# Patient Record
Sex: Male | Born: 1940 | Race: White | Hispanic: No | Marital: Married | Smoking: Former smoker
Health system: Southern US, Community
[De-identification: ages and names within clinical notes are randomized; demographics above are authoritative.]

## PROBLEM LIST (undated history)

## (undated) DIAGNOSIS — G912 (Idiopathic) normal pressure hydrocephalus: Secondary | ICD-10-CM

## (undated) DIAGNOSIS — M5410 Radiculopathy, site unspecified: Secondary | ICD-10-CM

## (undated) DIAGNOSIS — M5030 Other cervical disc degeneration, unspecified cervical region: Secondary | ICD-10-CM

## (undated) DIAGNOSIS — F319 Bipolar disorder, unspecified: Secondary | ICD-10-CM

## (undated) DIAGNOSIS — G43909 Migraine, unspecified, not intractable, without status migrainosus: Secondary | ICD-10-CM

## (undated) DIAGNOSIS — M109 Gout, unspecified: Secondary | ICD-10-CM

## (undated) DIAGNOSIS — I447 Left bundle-branch block, unspecified: Secondary | ICD-10-CM

## (undated) DIAGNOSIS — G219 Secondary parkinsonism, unspecified: Principal | ICD-10-CM

## (undated) DIAGNOSIS — K5790 Diverticulosis of intestine, part unspecified, without perforation or abscess without bleeding: Secondary | ICD-10-CM

## (undated) DIAGNOSIS — E785 Hyperlipidemia, unspecified: Secondary | ICD-10-CM

## (undated) DIAGNOSIS — F988 Other specified behavioral and emotional disorders with onset usually occurring in childhood and adolescence: Secondary | ICD-10-CM

## (undated) DIAGNOSIS — R269 Unspecified abnormalities of gait and mobility: Secondary | ICD-10-CM

## (undated) DIAGNOSIS — I493 Ventricular premature depolarization: Secondary | ICD-10-CM

## (undated) DIAGNOSIS — I10 Essential (primary) hypertension: Secondary | ICD-10-CM

## (undated) DIAGNOSIS — F329 Major depressive disorder, single episode, unspecified: Secondary | ICD-10-CM

## (undated) DIAGNOSIS — G910 Communicating hydrocephalus: Secondary | ICD-10-CM

## (undated) DIAGNOSIS — F32A Depression, unspecified: Secondary | ICD-10-CM

## (undated) DIAGNOSIS — B029 Zoster without complications: Secondary | ICD-10-CM

## (undated) DIAGNOSIS — N400 Enlarged prostate without lower urinary tract symptoms: Secondary | ICD-10-CM

## (undated) DIAGNOSIS — G9389 Other specified disorders of brain: Secondary | ICD-10-CM

## (undated) HISTORY — DX: Hyperlipidemia, unspecified: E78.5

## (undated) HISTORY — DX: Secondary parkinsonism, unspecified: G21.9

## (undated) HISTORY — DX: Unspecified abnormalities of gait and mobility: R26.9

## (undated) HISTORY — DX: Bipolar disorder, unspecified: F31.9

## (undated) HISTORY — DX: Depression, unspecified: F32.A

## (undated) HISTORY — DX: Benign prostatic hyperplasia without lower urinary tract symptoms: N40.0

## (undated) HISTORY — DX: Gout, unspecified: M10.9

## (undated) HISTORY — DX: (Idiopathic) normal pressure hydrocephalus: G91.2

## (undated) HISTORY — DX: Other cervical disc degeneration, unspecified cervical region: M50.30

## (undated) HISTORY — DX: Radiculopathy, site unspecified: M54.10

## (undated) HISTORY — DX: Essential (primary) hypertension: I10

## (undated) HISTORY — DX: Migraine, unspecified, not intractable, without status migrainosus: G43.909

## (undated) HISTORY — PX: BELPHAROPTOSIS REPAIR: SHX369

## (undated) HISTORY — DX: Diverticulosis of intestine, part unspecified, without perforation or abscess without bleeding: K57.90

## (undated) HISTORY — DX: Ventricular premature depolarization: I49.3

## (undated) HISTORY — PX: TONSILLECTOMY AND ADENOIDECTOMY: SUR1326

## (undated) HISTORY — DX: Zoster without complications: B02.9

## (undated) HISTORY — PX: CATARACT EXTRACTION: SUR2

## (undated) HISTORY — DX: Other specified disorders of brain: G93.89

## (undated) HISTORY — DX: Left bundle-branch block, unspecified: I44.7

## (undated) HISTORY — DX: Communicating hydrocephalus: G91.0

## (undated) HISTORY — DX: Major depressive disorder, single episode, unspecified: F32.9

## (undated) HISTORY — DX: Other specified behavioral and emotional disorders with onset usually occurring in childhood and adolescence: F98.8

---

## 1999-04-09 ENCOUNTER — Ambulatory Visit (HOSPITAL_BASED_OUTPATIENT_CLINIC_OR_DEPARTMENT_OTHER): Admission: RE | Admit: 1999-04-09 | Discharge: 1999-04-09 | Payer: Self-pay

## 2003-04-08 ENCOUNTER — Encounter: Admission: RE | Admit: 2003-04-08 | Discharge: 2003-04-08 | Payer: Self-pay | Admitting: Family Medicine

## 2003-04-08 ENCOUNTER — Encounter: Payer: Self-pay | Admitting: Family Medicine

## 2005-09-26 ENCOUNTER — Encounter: Admission: RE | Admit: 2005-09-26 | Discharge: 2005-09-26 | Payer: Self-pay | Admitting: Family Medicine

## 2005-10-03 ENCOUNTER — Encounter: Admission: RE | Admit: 2005-10-03 | Discharge: 2005-10-03 | Payer: Self-pay | Admitting: Family Medicine

## 2005-11-02 ENCOUNTER — Ambulatory Visit: Payer: Self-pay | Admitting: Cardiology

## 2005-11-15 ENCOUNTER — Ambulatory Visit: Payer: Self-pay

## 2005-11-15 ENCOUNTER — Encounter: Payer: Self-pay | Admitting: Cardiology

## 2005-11-21 ENCOUNTER — Ambulatory Visit: Payer: Self-pay | Admitting: Cardiology

## 2006-01-24 ENCOUNTER — Encounter (INDEPENDENT_AMBULATORY_CARE_PROVIDER_SITE_OTHER): Payer: Self-pay | Admitting: *Deleted

## 2006-01-24 ENCOUNTER — Ambulatory Visit (HOSPITAL_BASED_OUTPATIENT_CLINIC_OR_DEPARTMENT_OTHER): Admission: RE | Admit: 2006-01-24 | Discharge: 2006-01-24 | Payer: Self-pay | Admitting: Plastic Surgery

## 2007-02-20 ENCOUNTER — Encounter: Payer: Self-pay | Admitting: Cardiology

## 2007-02-20 ENCOUNTER — Ambulatory Visit: Payer: Self-pay

## 2008-04-19 ENCOUNTER — Encounter: Payer: Self-pay | Admitting: Emergency Medicine

## 2008-04-20 ENCOUNTER — Ambulatory Visit: Payer: Self-pay | Admitting: Vascular Surgery

## 2008-04-20 ENCOUNTER — Encounter (INDEPENDENT_AMBULATORY_CARE_PROVIDER_SITE_OTHER): Payer: Self-pay | Admitting: Internal Medicine

## 2008-04-20 ENCOUNTER — Inpatient Hospital Stay (HOSPITAL_COMMUNITY): Admission: EM | Admit: 2008-04-20 | Discharge: 2008-04-20 | Payer: Self-pay | Admitting: Emergency Medicine

## 2008-04-20 ENCOUNTER — Ambulatory Visit: Payer: Self-pay | Admitting: Cardiovascular Disease

## 2008-10-24 ENCOUNTER — Encounter: Admission: RE | Admit: 2008-10-24 | Discharge: 2008-10-24 | Payer: Self-pay | Admitting: Family Medicine

## 2009-01-25 ENCOUNTER — Ambulatory Visit (HOSPITAL_COMMUNITY): Payer: Self-pay | Admitting: Psychiatry

## 2009-04-21 ENCOUNTER — Ambulatory Visit (HOSPITAL_COMMUNITY): Payer: Self-pay | Admitting: Psychiatry

## 2009-05-17 ENCOUNTER — Encounter: Admission: RE | Admit: 2009-05-17 | Discharge: 2009-05-17 | Payer: Self-pay | Admitting: Family Medicine

## 2009-07-25 ENCOUNTER — Encounter: Admission: RE | Admit: 2009-07-25 | Discharge: 2009-07-25 | Payer: Self-pay | Admitting: Family Medicine

## 2010-02-08 ENCOUNTER — Encounter: Admission: RE | Admit: 2010-02-08 | Discharge: 2010-02-08 | Payer: Self-pay | Admitting: Family Medicine

## 2010-05-16 ENCOUNTER — Ambulatory Visit (HOSPITAL_COMMUNITY): Admission: RE | Admit: 2010-05-16 | Discharge: 2010-05-16 | Payer: Self-pay | Admitting: Family Medicine

## 2010-05-16 ENCOUNTER — Ambulatory Visit: Payer: Self-pay | Admitting: Cardiology

## 2010-05-16 ENCOUNTER — Ambulatory Visit: Payer: Self-pay

## 2010-11-19 ENCOUNTER — Encounter: Payer: Self-pay | Admitting: Family Medicine

## 2010-11-27 ENCOUNTER — Encounter
Admission: RE | Admit: 2010-11-27 | Discharge: 2010-11-27 | Payer: Self-pay | Source: Home / Self Care | Attending: Family Medicine | Admitting: Family Medicine

## 2011-02-08 ENCOUNTER — Other Ambulatory Visit: Payer: Self-pay | Admitting: Family Medicine

## 2011-03-12 NOTE — H&P (Signed)
NAME:  Clayton Burke, Clayton Burke                  ACCOUNT NO.:  [id number]   MEDICAL RECORD NO.:  [id number]          PATIENT TYPE:  INP   LOCATION:  3011                         FACILITY:  [facility]   PHYSICIAN:  Lonia Blood, M.D.       DATE OF BIRTH:  September 03, 1941   DATE OF ADMISSION:  04/19/2008  DATE OF DISCHARGE:  04/20/2008                              HISTORY & PHYSICAL   PRIMARY CARE PHYSICIAN:  Mosetta Putt, MD   CHIEF COMPLAINT:  Blurry vision and difficulty with walking.   HISTORY OF PRESENT ILLNESS:  Clayton Burke is a 70 year old gentleman with  history of hypertension, hyperlipidemia who presented tonight at the  [facility] after 2 weeks of episodes of double vision.  This  morning, he also had an episode where he stumbled and could not walk.  At that point in time, he went to the emergency room at [facility] where he had a stat MRI of the brain which did not indicate  presence of an acute stroke.  He was discharged home, but then as he was  walking his dogs he had again an episode of possible difficulty walking.  He currently feels back to his normal self.   PAST MEDICAL HISTORY:  1. Depression.  2. Migraine headaches.  3. BPH.  4. Hypertension.  5. Hyperlipidemia.  6. PVCs.   HOME MEDICATIONS:  Flomax, finasteride, Cozaar, Inderal, Paxil,  lovastatin, Ambien, and alprazolam.   SOCIAL HISTORY:  The patient is married.  He has a very remote history  of tobacco use.  No alcohol or drugs.   FAMILY HISTORY:  Positive for massive stroke in father.   REVIEW OF SYSTEMS:  As per HPI.  All other systems reviewed and  negative.   PHYSICAL EXAMINATION:  VITAL SIGNS:  Upon admission indicates,  temperature 97.1, heart rate 54, respiratory rate 20, blood pressure  140/71, saturation 98% on room air.  GENERAL APPEARANCE:  Well-developed, well-nourished, in no acute  distress.  Alert and oriented to place, person, and time.  HEAD:  Normocephalic and atraumatic.  EYES:   Pupils equal, round, and reactive to light and accommodation.  Extraocular movements intact.  THROAT:  Clear.  NECK:  Supple.  No JVD.  CHEST:  Clear without wheezes or crackles.  HEART:  Regular rate and rhythm without murmurs, rubs, or gallops.  ABDOMEN:  Soft, nontender, nondistended, and bowel sounds are present.  LOWER EXTREMITIES:  Without edema.  NEUROLOGIC:  Cranial nerves II-XII intact.  Repetition intact.  Speech  intact.  Memory, he is able to remember 2/3 works.  He is able to do  serial 7s.  Cerebellum is intact.  Strength 5/5 in all 4 extremities.  Sensation intact.  Deep tendon reflexes are symmetric.   LABORATORY DATA:  Laboratory values done on admission pending.   ASSESSMENT/PLAN:  Probable transient ischemic attack in the posterior  circulation versus orthostasis versus symptomatic premature ventricular  contractions. I plan to admit the patient to telemetry.  Place him on  frequent neuro checks.  Add Plavix to his aspirin and obtain  an MRA of  the brain.  I will also check orthostatics, decrease the dose of  Lamictal, and keep the patient on telemetry to see how many premature  ventricular contractions he develops.  Neurological consultation will be  obtained in the morning.      Lonia Blood, M.D.  Electronically Signed     SL/MEDQ  D:  04/20/2008  T:  04/21/2008  Job:  [id number]   cc:   Mosetta Putt, M.D.

## 2011-03-12 NOTE — Consult Note (Signed)
NAME:  Clayton Burke, Clayton Burke                  ACCOUNT NO.:  [id number]   MEDICAL RECORD NO.:  [id number]          PATIENT TYPE:  INP   LOCATION:  3011                         FACILITY:  [facility]   PHYSICIAN:  Levert Feinstein, MD          DATE OF BIRTH:  07/05/1941   DATE OF CONSULTATION:  04/20/2008  DATE OF DISCHARGE:  04/20/2008                                 CONSULTATION   CHIEF COMPLAINT:  Double vision.   HISTORY OF PRESENT ILLNESS:  This is a 70 year old male, with past  medical history of hypertension, hyperlipidemia, depression, presenting  with 2 weeks history of intermittent horizontal diplopia.   Clayton Burke recorded that he has been on Lamictal since April, at 100 and  over past 2 months has gradually titrating up to current 400 every day  divided in b.i.d.  About 2 weeks ago, he began to noticed an  intermittent diplopia, often times happening in the morning times on his  way driving to work, he would noticed this slight separation of the road  sides signs, but it was only mild diplopia.  He did not try to cover one  eye to see if this from binocular.  It has never interferred with his  driving, lasted about 5 to 10 minutes.  Yesterday morning, he stopped at  the coffee shop, and besides the horizontal double vision, when he step  out of the car, he noticed that he has uncoordination of gait.  He has  to hold  the car, to steady himself.  Later, he called his wife,  presented to the [facility] Emergency Room.  MRI of the head without  contrast demonstrate atrophy, ventriculomegaly, periventricular white  matter disease, but there was no acute change.  The double vision lasted  about 1 hour and gradually subsided.  He reported that he see clock and  nurse face separated in to 2 side-by-side and the gait difficulty  gradually subsided within an hour as well. Yesterday night at 9:30 p.m.,  he walked his dog outsiden, when he looked back at his house light, he  noticed double vision reoccurred  again.  In the mean time, he felt  unsteady gait, which has prompt his ER visit and admission.  The whole  episode lasted about an hour, the gait difficulty seems to ling around  little bit longer.   Currently, he denies headache or visual change.  He has some chronic  bilateral fairly symmetric droopy eyelid, but it never to the point of  cover his vision and that seems to be fairly symmetric.  He denies  chewing, swallowing difficulty, and difficulty holding his neck up,  shortness of breath, or chest pain.   PAST MEDICAL HISTORY:  1. Hypertension.  2. Hyperlipidemia.  3. Depression.   PAST SURGICAL HISTORY:  None.   SOCIAL HISTORY:  He denies smoke or drinks.  Father died of sudden death  at age 28 with TIA and mother also died at age 26.   ALLERGIES:  No known drug allergies.   CURRENT MEDICATIONS:  1. Xanax  0.5 mg.  2. Aspirin 81 mg every day.  3. Plavix 75 mg every day.  4. Lamotrigine 100 mg b.i.d.  5. Cozaar 50 mg every day.  6. Protonix 40 mg every day.  7. Paxil 30 mg every day.  8. Imdur 60 mg every day.  9. Zocor 10 mg every day.  10.Flomax.  11.Ambien p.r.n.  12.Zofran.   PHYSICAL EXAMINATION:  VITAL SIGNS:  Temperature 97, heart rate of 60,  blood pressure 122/144, and diastolic is 68/71.  CARDIAC:  Regular rate and rhythm.  PULMONARY:  Clear to auscultation bilaterally.  NECK:  Supple.  No carotid bruits.  NEUROLOGIC:  He is alert, oriented to history taking and casual  conversation. Cranial nerves II-XII. Pupils were equal, round, reactive  to light and accommodation.  Fundi were sharp bilaterally.  Visual  fields were full on confrontational test.  Color and noncolor field feel  to damaged.  Extraocular movement abnormality.  There was no diplopia on  red lens testing.  He does has mild redundant tissue on the right side  and mild static bilateral ptosis.  There was mild questionable eye  closure and cheek puff weakness.  Tongue protrusion into cheek  strength  jaw opening and closure was normal.  He is hard of hearing.  MOTOR EXAMINATION:  Normal tone, bulk, and strength and foldings, neck  flexion and extension was normal.  Sensory intact to light touch and  pinprick.  Deep tendon reflexes were normal, symmetric.  Gait was  steady.  She was able to perform tip toe.  He was walking without  difficulty.  COORDINATION:  Normal finger-to-nose and heel-to-shin.   ASSESSMENT AND PLAN:  This is a 70 year old with past medical history of  hypertension, hyperlipidemia, recently titrating  dose of Lamictal  presenting with 2 weeks history of intermittent diplopia, with 2 episode  associated with ataxic gait, but denies vertigo.  That certainly raised  the possibility of cerebral vascular event; however, the fact that is  intermittent in nature with current normal neurological examination and  negative MRI of the brain, it is essential Heath out  the brain  stem/cerebellum stroke.  The other differentiation diagnosis including  myasthenia gravis versus medicine-induced side effect.   PLAN:  1. CT of the chest to [place] out the thymus abnormality.  2. Acetylcholine receptor antibody, anti-MuSK antibody.  3. Above workup can be done as outpatient.  4. Stroke prevention is recommended, the patient is on aspirin and      Plavix,  either one is sufficient.      Levert Feinstein, MD  Electronically Signed     YY/MEDQ  D:  04/20/2008  T:  04/21/2008  Job:  [id number]

## 2011-03-15 NOTE — Letter (Signed)
Mar 05, 2007    Mosetta Putt, M.D.  [address] [place], [place] [address]   RE:  BRENTON, JOINES  MRN:  [id number]  /  DOB:  February 06, 1941   Dear Cindee Lame:   I received your note of February 24, 2007, concerning Mr. Brookover.  I have  reviewed the echo report dated February 20, 2007.  The finding of some  redundancy of the intraatrial septum with borderline criteria for atrial  septal aneurysm is an insignificant finding.  Atrial septal aneurysm is  a term that relates only to some mild increased motion of the atrial  septum.  It does not carry any significant risk as it relates to  ventricular ectopy.  From the report, there is no evidence of a shunt at  this level.   Also mentioned in the report is mild bowing of the posterior leaflet of  the mitral valve but no strict criteria for mitral valve prolapse.  Again, this is an insignificant finding.   I am sorry to hear that Mr. Jumper still has PVCs.  However, I am not  surprised.  The echocardiogram shows that he continues to have good left  ventricular function.  I hope that this response is helpful to you.    Sincerely,      Luis Abed, MD, [facility]  Electronically Signed    JDK/MedQ  DD: 03/05/2007  DT: 03/05/2007  Job #: [id number]

## 2011-03-15 NOTE — Discharge Summary (Signed)
NAME:  Clayton Burke, Clayton Burke                  ACCOUNT NO.:  [id number]   MEDICAL RECORD NO.:  [id number]          PATIENT TYPE:  INP   LOCATION:  3011                         FACILITY:  [facility]   PHYSICIAN:  Altha Harm, MDDATE OF BIRTH:  1940-12-07   DATE OF ADMISSION:  04/19/2008  DATE OF DISCHARGE:  04/20/2008                               DISCHARGE SUMMARY   DISCHARGE DIAGNOSES:  1. Possible transient ischemic attack.  2. Vision changes.  3. History of bipolar disorder.  4. History of depression.  5. Migraine headaches.  6. Benign prostatic hypertrophy.  7. Hypertension.  8. Hyperlipidemia.   DISCHARGE MEDICATIONS:  1. Lamictal 100 mg p.o. daily.  2. Aspirin 325 mg p.o. daily.  3. Cozaar 50 mg daily.  4. Paxil 30 mg daily.  5. Flomax 0.4 mg p.o. daily.  6. Hydrochlorothiazide 25 mg p.o. daily.  7. Ambien 10 mg p.o. nightly.  8. Finasteride p.o. daily.   CONSULTANTS:  Dr. Dianah Field, neurology.   PROCEDURES:  None.   DIAGNOSTIC STUDIES:  1. CT of the chest without contrast:  Shows no evidence of thymic      nodules to suggest myasthenia gravis, no acute processes of the      chest.  2. A carotid duplex which shows no significant plaque visualized and      no ACI stenosis.  3. A 2-D echocardiogram which shows very poorly visualized, probably      mildly decreased EF; with question of anterior hypokinesis.      Suggest MRI to quantitate EF and visual wall motion abnormalities.   CHIEF COMPLAINT:  Double vision.   HISTORY OF PRESENT ILLNESS:  Please see the history and physical  dictated by Dr. Lavera Guise for details.   HOSPITAL COURSE:  The patient was admitted into the hospital after he  had been seen earlier that day in the emergency room; with a diagnosis  of diplopia.  On this hospitalization he was seen by Dr. Dianah Field of  neurology, who felt that the patient had no acute neurological process  and could be followed up as an outpatient.  The patient wanted to be  discharged and  was sent home despite the echocardiogram results not  being available at the time.   FOLLOWUP:  He is to follow up with Dr. Maple Hudson in her office in 1-2 weeks.  He is to follow up with his primary care physician in one week.   DIETARY INSTRUCTIONS:  The patient should be on a heart healthy, low  sodium diet.   PHYSICAL INSTRUCTIONS:  None.      Altha Harm, MD  Electronically Signed     MAM/MEDQ  D:  05/11/2008  T:  05/11/2008  Job:  [id number]

## 2011-03-15 NOTE — Op Note (Signed)
NAME:  CASSIEL, FERNANDEZ                  ACCOUNT NO.:  [id number]   MEDICAL RECORD NO.:  [id number]          PATIENT TYPE:  AMB   LOCATION:  DSC                          FACILITY:  [facility]   PHYSICIAN:  Alfredia Ferguson, M.D.  DATE OF BIRTH:  12-10-1940   DATE OF PROCEDURE:  01/24/2006  DATE OF DISCHARGE:                                 OPERATIVE REPORT   PREOPERATIVE DIAGNOSIS:  A 1-cm sebaceous cyst, anterior scalp, midline.   POSTOPERATIVE DIAGNOSIS:  A 1-cm sebaceous cyst, anterior scalp, midline.   PROCEDURE:  Excision of sebaceous cyst, anterior scalp.   SURGEON:  Alfredia Ferguson, M.D.   ANESTHESIA:  2% Xylocaine with 1:100,000 epinephrine.   INDICATIONS FOR PROCEDURE:  This is a 70 year old gentleman with a slowly  enlarging subcutaneous cyst located on his anterior scalp in the midline.  He wishes to have it excised.  The patient understands the risks of the  surgery, including recurrence of the cyst, infection, scarring, alopecia at  the incision site, and the need for touch up surgery.  In spite of that, the  patient wishes to proceed with the operation.   DESCRIPTION OF PROCEDURE:  The hair was shaved around the cyst.  Skin marks  were placed, and local anesthesia was infiltrated.  The area was prepped  with Betadine and draped with sterile drapes.   An elliptical incision was made directly over the cyst.  The cyst was  identified and dissected away from the surrounding tissue using blunt  dissection.  The cyst was removed in its entirety.  Hemostasis was  accomplished using cautery.  The wound was closed with interrupted 4-0 nylon  suture.  The area was cleansed and dried.  The patient was discharged home  in satisfactory condition.      Alfredia Ferguson, M.D.  Electronically Signed     WBB/MEDQ  D:  01/24/2006  T:  01/26/2006  Job:  [id number]

## 2011-07-25 LAB — PROTIME-INR
INR: 0.9
Prothrombin Time: 12.8

## 2011-07-25 LAB — CARDIAC PANEL(CRET KIN+CKTOT+MB+TROPI)
CK, MB: 1.3
Relative Index: INVALID
Total CK: 57
Troponin I: <0.01

## 2011-07-25 LAB — COMPREHENSIVE METABOLIC PANEL
ALT: 17
ALT: 15
AST: 21
Albumin: 3.8
Albumin: 4.1
Alkaline Phosphatase: 70
Alkaline Phosphatase: 68
BUN: 11
BUN: 9
CO2: 31
Calcium: 9.1
Chloride: 99
Chloride: 101
Creatinine, Ser: 1.06
GFR calc Af Amer: >60
GFR calc non Af Amer: >60
Glucose, Bld: 196 — ABNORMAL HIGH
Glucose, Bld: 118 — ABNORMAL HIGH
Potassium: 3.6
Potassium: 4.3
Sodium: 139
Sodium: 138
Total Bilirubin: 0.8
Total Bilirubin: 1.3 — ABNORMAL HIGH
Total Protein: 5.8 — ABNORMAL LOW
Total Protein: 6.3

## 2011-07-25 LAB — DIFFERENTIAL
Basophils Absolute: 0
Basophils Relative: 0
Eosinophils Absolute: 0.1
Monocytes Absolute: 0.5
Monocytes Relative: 10
Neutro Abs: 3.5
Neutrophils Relative %: 68

## 2011-07-25 LAB — BLOOD GAS, ARTERIAL
Acid-Base Excess: 4.1 — ABNORMAL HIGH
Bicarbonate: 28.5 — ABNORMAL HIGH
FIO2: 0.21
O2 Saturation: 97.3
Patient temperature: 98.6
TCO2: 30
pCO2 arterial: 46.9 — ABNORMAL HIGH
pH, Arterial: 7.402
pO2, Arterial: 83.5

## 2011-07-25 LAB — LIPID PANEL
HDL: 41
LDL Cholesterol: 54
Total CHOL/HDL Ratio: 2.7
Triglycerides: 73
VLDL: 15

## 2011-07-25 LAB — URINALYSIS, ROUTINE W REFLEX MICROSCOPIC
Bilirubin Urine: NEGATIVE
Glucose, UA: NEGATIVE
Hgb urine dipstick: NEGATIVE
Ketones, ur: NEGATIVE
Nitrite: NEGATIVE
Protein, ur: NEGATIVE
Specific Gravity, Urine: 1.007
Urobilinogen, UA: 0.2
pH: 6.5

## 2011-07-25 LAB — CBC
HCT: 39.7
HCT: 42.2
Hemoglobin: 14
Hemoglobin: 14.7
MCHC: 35.4
MCV: 89
Platelets: 166
RBC: 4.46
RDW: 13.6
WBC: 5.5
WBC: 5.1

## 2011-07-25 LAB — URINE DRUGS OF ABUSE SCREEN W ALC, ROUTINE (REF LAB)
Amphetamine Screen, Ur: NEGATIVE
Barbiturate Quant, Ur: NEGATIVE
Benzodiazepines.: NEGATIVE
Cocaine Metabolites: NEGATIVE
Creatinine,U: 31.7
Ethyl Alcohol: <10
Marijuana Metabolite: NEGATIVE
Methadone: NEGATIVE
Opiate Screen, Urine: NEGATIVE
Phencyclidine (PCP): NEGATIVE
Propoxyphene: NEGATIVE

## 2011-07-25 LAB — HOMOCYSTEINE: Homocysteine: 9.8

## 2011-07-25 LAB — HEMOGLOBIN A1C
Hgb A1c MFr Bld: 4.9
Mean Plasma Glucose: 97

## 2011-07-25 LAB — TSH: TSH: 2.621

## 2011-07-25 LAB — ACETYLCHOLINE RECEPTOR, BINDING: Acetylcholine Receptor Ab: 0 nmol/L (ref 0.0–0.4)

## 2011-07-25 LAB — ETHANOL: Alcohol, Ethyl (B): <5

## 2011-07-25 LAB — RPR: RPR Ser Ql: NONREACTIVE

## 2011-08-08 ENCOUNTER — Ambulatory Visit (HOSPITAL_COMMUNITY): Payer: Self-pay | Admitting: Physician Assistant

## 2012-04-15 ENCOUNTER — Other Ambulatory Visit: Payer: Self-pay | Admitting: Family Medicine

## 2012-04-15 DIAGNOSIS — M545 Low back pain, unspecified: Secondary | ICD-10-CM

## 2012-04-15 DIAGNOSIS — IMO0002 Reserved for concepts with insufficient information to code with codable children: Secondary | ICD-10-CM

## 2012-04-18 ENCOUNTER — Other Ambulatory Visit: Payer: Self-pay

## 2012-05-01 ENCOUNTER — Ambulatory Visit
Admission: RE | Admit: 2012-05-01 | Discharge: 2012-05-01 | Disposition: A | Discharge status: Home / Self Care | Payer: Medicare Other | Source: Ambulatory Visit | Attending: Family Medicine | Admitting: Family Medicine

## 2012-05-01 DIAGNOSIS — M545 Low back pain, unspecified: Secondary | ICD-10-CM

## 2012-05-01 DIAGNOSIS — IMO0002 Reserved for concepts with insufficient information to code with codable children: Secondary | ICD-10-CM

## 2012-05-08 ENCOUNTER — Other Ambulatory Visit: Payer: Self-pay | Admitting: Family Medicine

## 2012-05-08 DIAGNOSIS — M545 Low back pain, unspecified: Secondary | ICD-10-CM

## 2012-05-08 DIAGNOSIS — IMO0002 Reserved for concepts with insufficient information to code with codable children: Secondary | ICD-10-CM

## 2012-06-26 ENCOUNTER — Other Ambulatory Visit: Payer: Medicare Other

## 2012-07-06 ENCOUNTER — Other Ambulatory Visit: Payer: Medicare Other

## 2012-07-24 ENCOUNTER — Ambulatory Visit
Admission: RE | Admit: 2012-07-24 | Discharge: 2012-07-24 | Disposition: A | Discharge status: Home / Self Care | Payer: Medicare Other | Source: Ambulatory Visit | Attending: Family Medicine | Admitting: Family Medicine

## 2012-07-24 DIAGNOSIS — M545 Low back pain, unspecified: Secondary | ICD-10-CM

## 2012-07-24 DIAGNOSIS — IMO0002 Reserved for concepts with insufficient information to code with codable children: Secondary | ICD-10-CM

## 2012-07-24 MED ORDER — IOHEXOL 180 MG/ML  SOLN
1.0000 mL | Freq: Once | INTRAMUSCULAR | Status: AC | PRN
  Administered 2012-07-24: 1 mL via EPIDURAL

## 2012-07-24 MED ORDER — METHYLPREDNISOLONE ACETATE 40 MG/ML INJ SUSP (RADIOLOG
120.0000 mg | Freq: Once | INTRAMUSCULAR | Status: AC
  Administered 2012-07-24: 120 mg via EPIDURAL

## 2012-10-29 ENCOUNTER — Ambulatory Visit
Admission: RE | Admit: 2012-10-29 | Discharge: 2012-10-29 | Disposition: A | Discharge status: Home / Self Care | Payer: Medicare Other | Source: Ambulatory Visit | Attending: Family Medicine | Admitting: Family Medicine

## 2012-10-29 ENCOUNTER — Other Ambulatory Visit: Payer: Self-pay | Admitting: Family Medicine

## 2012-10-29 DIAGNOSIS — M25559 Pain in unspecified hip: Secondary | ICD-10-CM

## 2012-11-02 ENCOUNTER — Other Ambulatory Visit: Payer: Self-pay | Admitting: Family Medicine

## 2012-11-02 DIAGNOSIS — M25551 Pain in right hip: Secondary | ICD-10-CM

## 2012-11-04 ENCOUNTER — Other Ambulatory Visit: Payer: Medicare Other

## 2012-11-06 ENCOUNTER — Ambulatory Visit
Admission: RE | Admit: 2012-11-06 | Discharge: 2012-11-06 | Disposition: A | Discharge status: Home / Self Care | Payer: Medicare Other | Source: Ambulatory Visit | Attending: Family Medicine | Admitting: Family Medicine

## 2012-11-06 DIAGNOSIS — M25551 Pain in right hip: Secondary | ICD-10-CM

## 2012-11-09 ENCOUNTER — Other Ambulatory Visit: Payer: Self-pay | Admitting: Family Medicine

## 2012-11-09 DIAGNOSIS — M7060 Trochanteric bursitis, unspecified hip: Secondary | ICD-10-CM

## 2012-11-09 DIAGNOSIS — M25551 Pain in right hip: Secondary | ICD-10-CM

## 2012-11-11 ENCOUNTER — Ambulatory Visit
Admission: RE | Admit: 2012-11-11 | Discharge: 2012-11-11 | Disposition: A | Discharge status: Home / Self Care | Payer: Medicare Other | Source: Ambulatory Visit | Attending: Family Medicine | Admitting: Family Medicine

## 2012-11-11 DIAGNOSIS — M25551 Pain in right hip: Secondary | ICD-10-CM

## 2012-11-11 DIAGNOSIS — M7060 Trochanteric bursitis, unspecified hip: Secondary | ICD-10-CM

## 2012-11-11 MED ORDER — METHYLPREDNISOLONE ACETATE 40 MG/ML INJ SUSP (RADIOLOG
120.0000 mg | Freq: Once | INTRAMUSCULAR | Status: AC
  Administered 2012-11-11: 120 mg via INTRAMUSCULAR

## 2012-11-11 MED ORDER — IOHEXOL 180 MG/ML  SOLN
1.0000 mL | Freq: Once | INTRAMUSCULAR | Status: AC | PRN
  Administered 2012-11-11: 1 mL via ORAL

## 2012-12-12 ENCOUNTER — Other Ambulatory Visit: Payer: Self-pay

## 2013-01-22 ENCOUNTER — Other Ambulatory Visit: Payer: Self-pay | Admitting: Family Medicine

## 2013-01-22 DIAGNOSIS — M7061 Trochanteric bursitis, right hip: Secondary | ICD-10-CM

## 2013-01-25 ENCOUNTER — Other Ambulatory Visit: Payer: Self-pay | Admitting: Family Medicine

## 2013-01-25 ENCOUNTER — Ambulatory Visit
Admission: RE | Admit: 2013-01-25 | Discharge: 2013-01-25 | Disposition: A | Discharge status: Home / Self Care | Payer: Medicare Other | Source: Ambulatory Visit | Attending: Family Medicine | Admitting: Family Medicine

## 2013-01-25 DIAGNOSIS — M7061 Trochanteric bursitis, right hip: Secondary | ICD-10-CM

## 2013-01-25 MED ORDER — METHYLPREDNISOLONE ACETATE 40 MG/ML INJ SUSP (RADIOLOG
120.0000 mg | Freq: Once | INTRAMUSCULAR | Status: AC
  Administered 2013-01-25: 120 mg via INTRA_ARTICULAR

## 2013-01-25 MED ORDER — IOHEXOL 180 MG/ML  SOLN
1.0000 mL | Freq: Once | INTRAMUSCULAR | Status: AC | PRN
  Administered 2013-01-25: 1 mL via INTRA_ARTICULAR

## 2013-02-19 ENCOUNTER — Other Ambulatory Visit: Payer: Self-pay | Admitting: Family Medicine

## 2013-02-19 DIAGNOSIS — R29898 Other symptoms and signs involving the musculoskeletal system: Secondary | ICD-10-CM

## 2013-02-24 ENCOUNTER — Encounter: Payer: Self-pay | Admitting: Cardiovascular Disease

## 2013-02-24 ENCOUNTER — Telehealth: Payer: Self-pay | Admitting: *Deleted

## 2013-02-24 ENCOUNTER — Ambulatory Visit (INDEPENDENT_AMBULATORY_CARE_PROVIDER_SITE_OTHER): Payer: Medicare Other | Admitting: *Deleted

## 2013-02-24 DIAGNOSIS — I447 Left bundle-branch block, unspecified: Secondary | ICD-10-CM

## 2013-02-24 DIAGNOSIS — R42 Dizziness and giddiness: Secondary | ICD-10-CM

## 2013-02-24 DIAGNOSIS — R29898 Other symptoms and signs involving the musculoskeletal system: Secondary | ICD-10-CM

## 2013-02-24 NOTE — Telephone Encounter (Signed)
S/w Cordelia Pen over at Dr. Geoffery Lyons office to make sure the 24 hour monitor would only be worn for 21 hours due to a MRI tomorrow, Dr. Isidore Moos stated that would be ok

## 2013-02-25 ENCOUNTER — Ambulatory Visit
Admission: RE | Admit: 2013-02-25 | Discharge: 2013-02-25 | Disposition: A | Discharge status: Home / Self Care | Payer: Medicare Other | Source: Ambulatory Visit | Attending: Family Medicine | Admitting: Family Medicine

## 2013-02-25 DIAGNOSIS — R29898 Other symptoms and signs involving the musculoskeletal system: Secondary | ICD-10-CM

## 2013-02-26 ENCOUNTER — Encounter: Payer: Self-pay | Admitting: Cardiology

## 2013-03-02 ENCOUNTER — Other Ambulatory Visit: Payer: Self-pay | Admitting: Family Medicine

## 2013-03-02 DIAGNOSIS — G8929 Other chronic pain: Secondary | ICD-10-CM

## 2013-03-02 DIAGNOSIS — M25552 Pain in left hip: Secondary | ICD-10-CM

## 2013-03-06 ENCOUNTER — Ambulatory Visit
Admission: RE | Admit: 2013-03-06 | Discharge: 2013-03-06 | Disposition: A | Discharge status: Home / Self Care | Payer: Medicare Other | Source: Ambulatory Visit | Attending: Family Medicine | Admitting: Family Medicine

## 2013-03-06 DIAGNOSIS — G8929 Other chronic pain: Secondary | ICD-10-CM

## 2013-03-06 DIAGNOSIS — M25552 Pain in left hip: Secondary | ICD-10-CM

## 2013-03-10 ENCOUNTER — Other Ambulatory Visit: Payer: Self-pay | Admitting: Family Medicine

## 2013-03-10 DIAGNOSIS — M7130 Other bursal cyst, unspecified site: Secondary | ICD-10-CM

## 2013-03-10 DIAGNOSIS — M479 Spondylosis, unspecified: Secondary | ICD-10-CM

## 2013-03-18 ENCOUNTER — Ambulatory Visit
Admission: RE | Admit: 2013-03-18 | Discharge: 2013-03-18 | Disposition: A | Discharge status: Home / Self Care | Payer: Medicare Other | Source: Ambulatory Visit | Attending: Family Medicine | Admitting: Family Medicine

## 2013-03-18 ENCOUNTER — Ambulatory Visit (INDEPENDENT_AMBULATORY_CARE_PROVIDER_SITE_OTHER): Payer: Medicare Other | Admitting: Cardiology

## 2013-03-18 ENCOUNTER — Other Ambulatory Visit: Payer: Self-pay | Admitting: Family Medicine

## 2013-03-18 ENCOUNTER — Encounter: Payer: Self-pay | Admitting: Cardiology

## 2013-03-18 VITALS — BP 133/70 | HR 56 | Ht 69.0 in | Wt 198.0 lb

## 2013-03-18 DIAGNOSIS — I4949 Other premature depolarization: Secondary | ICD-10-CM

## 2013-03-18 DIAGNOSIS — R9431 Abnormal electrocardiogram [ECG] [EKG]: Secondary | ICD-10-CM

## 2013-03-18 DIAGNOSIS — I493 Ventricular premature depolarization: Secondary | ICD-10-CM | POA: Insufficient documentation

## 2013-03-18 DIAGNOSIS — M7130 Other bursal cyst, unspecified site: Secondary | ICD-10-CM

## 2013-03-18 DIAGNOSIS — M479 Spondylosis, unspecified: Secondary | ICD-10-CM

## 2013-03-18 DIAGNOSIS — I447 Left bundle-branch block, unspecified: Secondary | ICD-10-CM | POA: Insufficient documentation

## 2013-03-18 MED ORDER — IOHEXOL 180 MG/ML  SOLN
1.0000 mL | Freq: Once | INTRAMUSCULAR | Status: AC | PRN
  Administered 2013-03-18: 1 mL via EPIDURAL

## 2013-03-18 MED ORDER — METHYLPREDNISOLONE ACETATE 40 MG/ML INJ SUSP (RADIOLOG
120.0000 mg | Freq: Once | INTRAMUSCULAR | Status: AC
  Administered 2013-03-18: 120 mg via EPIDURAL

## 2013-03-18 NOTE — Progress Notes (Signed)
Monitor place on 02/24/13 by Daphene Calamity

## 2013-03-18 NOTE — Progress Notes (Signed)
HPI The patient presents for evaluation of intermittent left bundle branch block. He has had a long history of PVCs. These used to the particularly symptomatic. He was evaluated in the past and they were managed conservatively with beta blockers. He's had left bundle branch block noted in the past. He's had an echocardiogram most recently in 2011 that demonstrated well-preserved ejection fraction and no significant abnormalities. The recently described while   Carolyne Fiscal, MD some episodes of lightheadedness. With me he is not reporting these and denies any presyncope or syncope. The patient denies any new symptoms such as chest discomfort, neck or arm discomfort. There has been no new shortness of breath, PND or orthopnea. There have been no reported palpitations, presyncope or syncope.  No Known Allergies  Current Outpatient Prescriptions  Medication Sig Dispense Refill  . ABILIFY 5 MG tablet Take 5 mg by mouth daily. 2.5 MG DAILY      . ALPRAZolam (XANAX) 0.5 MG tablet Take 0.5 mg by mouth as needed for sleep.      Marland Kitchen aspirin 81 MG tablet Take 81 mg by mouth daily.      Marland Kitchen buPROPion (WELLBUTRIN XL) 300 MG 24 hr tablet Take 300 mg by mouth every morning.       . chlorthalidone (HYGROTON) 25 MG tablet Take 25 mg by mouth daily. 12.5 MG DAILY      . finasteride (PROSCAR) 5 MG tablet Take 5 mg by mouth daily.       Marland Kitchen FLUoxetine (PROZAC) 20 MG capsule Take 20 mg by mouth daily.      Marland Kitchen HYDROcodone-acetaminophen (VICODIN) 5-500 MG per tablet Take 1 tablet by mouth every 6 (six) hours as needed for pain. 1-2 DAILY      . lamoTRIgine (LAMICTAL) 100 MG tablet Take 100 mg by mouth 2 (two) times daily.      Marland Kitchen losartan (COZAAR) 100 MG tablet Take 100 mg by mouth daily.      Marland Kitchen lovastatin (MEVACOR) 20 MG tablet Take 20 mg by mouth at bedtime.       . Multiple Vitamin (MULTIVITAMIN) tablet Take 1 tablet by mouth daily.      . propranolol (INDERAL) 10 MG tablet Take 10 mg by mouth 2 (two) times daily.       . tamsulosin (FLOMAX) 0.4 MG CAPS Take 0.4 mg by mouth 2 (two) times daily.      Marland Kitchen zolpidem (AMBIEN) 10 MG tablet Take 10 mg by mouth at bedtime as needed for sleep. 5 MG - 10 MG       No current facility-administered medications for this visit.    No past medical history on file.  No past surgical history on file.  No family history on file.  History   Social History  . Marital Status: Married    Spouse Name: N/A    Number of Children: N/A  . Years of Education: N/A   Occupational History  . Not on file.   Social History Main Topics  . Smoking status: Former Smoker -- 2.00 packs/day for 12 years    Types: Cigarettes    Quit date: 07/24/1977  . Smokeless tobacco: Never Used  . Alcohol Use: Not on file  . Drug Use: Not on file  . Sexually Active: Not on file   Other Topics Concern  . Not on file   Social History Narrative  . No narrative on file    ROS:  Positive for pain, prostate enlargement.  Otherwise as  stated in the HPI and negative for all other systems.  PHYSICAL EXAM BP 133/70  Pulse 56  Ht 5\' 9"  (1.753 m)  Wt 198 lb (89.812 kg)  BMI 29.23 kg/m2 GENERAL:  Well appearing HEENT:  Pupils equal round and reactive, fundi not visualized, oral mucosa unremarkable NECK:  No jugular venous distention, waveform within normal limits, carotid upstroke brisk and symmetric, no bruits, no thyromegaly LYMPHATICS:  No cervical, inguinal adenopathy LUNGS:  Clear to auscultation bilaterally BACK:  No CVA tenderness CHEST:  Unremarkable HEART:  PMI not displaced or sustained,S1 and S2 within normal limits, no S3, no S4, no clicks, no rubs, no murmurs ABD:  Flat, positive bowel sounds normal in frequency in pitch, no bruits, no rebound, no guarding, no midline pulsatile mass, no hepatomegaly, no splenomegaly EXT:  2 plus pulses throughout, no edema, no cyanosis no clubbing SKIN:  No rashes no nodules NEURO:  Cranial nerves II through XII grossly intact, motor grossly  intact throughout PSYCH:  Cognitively intact, oriented to person place and time   EKG:  Sinus rhythm, rate 56, axis within normal limits, intervals within normal limits, no acute ST-T wave changes.  03/18/2013   ASSESSMENT AND PLAN  LBBB:  This has been noted in the past and seems to be intermittent. He does not always appear to be rate related on the monitor I reviewed. I do not suspect structural heart disease. However, I will be bringing him back for stress testing. I would like to see if I can reproduce the left bundle branch block.I will bring the patient back for a POET (Plain Old Exercise Test). This will allow me to screen for obstructive coronary disease, risk stratify and very importantly provide a prescription for exercise.  HTN:  This is well controlled.  He will continue with the meds as listed.  PVCs:  These are not particularly symptomatic. No change in therapy is indicated.

## 2013-03-18 NOTE — Patient Instructions (Addendum)
The current medical regimen is effective;  continue present plan and medications.  Your physician has requested that you have an exercise tolerance test. For further information please visit [email or web address]. Please also follow instruction sheet, as given.   

## 2013-03-30 ENCOUNTER — Other Ambulatory Visit: Payer: Self-pay | Admitting: Family Medicine

## 2013-03-30 ENCOUNTER — Encounter: Payer: Self-pay | Admitting: Cardiology

## 2013-03-30 DIAGNOSIS — R279 Unspecified lack of coordination: Secondary | ICD-10-CM

## 2013-03-30 DIAGNOSIS — R29898 Other symptoms and signs involving the musculoskeletal system: Secondary | ICD-10-CM

## 2013-04-13 ENCOUNTER — Other Ambulatory Visit: Payer: Medicare Other

## 2013-04-14 ENCOUNTER — Ambulatory Visit
Admission: RE | Admit: 2013-04-14 | Discharge: 2013-04-14 | Disposition: A | Discharge status: Home / Self Care | Payer: Medicare Other | Source: Ambulatory Visit | Attending: Family Medicine | Admitting: Family Medicine

## 2013-04-14 ENCOUNTER — Other Ambulatory Visit: Payer: Medicare Other

## 2013-04-14 DIAGNOSIS — R29898 Other symptoms and signs involving the musculoskeletal system: Secondary | ICD-10-CM

## 2013-04-14 DIAGNOSIS — R279 Unspecified lack of coordination: Secondary | ICD-10-CM

## 2013-04-14 MED ORDER — GADOBENATE DIMEGLUMINE 529 MG/ML IV SOLN
18.0000 mL | Freq: Once | INTRAVENOUS | Status: AC | PRN
  Administered 2013-04-14: 18 mL via INTRAVENOUS

## 2013-04-20 ENCOUNTER — Other Ambulatory Visit: Payer: Self-pay | Admitting: Family Medicine

## 2013-04-20 DIAGNOSIS — M549 Dorsalgia, unspecified: Secondary | ICD-10-CM

## 2013-04-27 ENCOUNTER — Ambulatory Visit
Admission: RE | Admit: 2013-04-27 | Discharge: 2013-04-27 | Disposition: A | Discharge status: Home / Self Care | Payer: Medicare Other | Source: Ambulatory Visit | Attending: Family Medicine | Admitting: Family Medicine

## 2013-04-27 DIAGNOSIS — M549 Dorsalgia, unspecified: Secondary | ICD-10-CM

## 2013-04-27 MED ORDER — METHYLPREDNISOLONE ACETATE 40 MG/ML INJ SUSP (RADIOLOG
120.0000 mg | Freq: Once | INTRAMUSCULAR | Status: AC
  Administered 2013-04-27: 120 mg via EPIDURAL

## 2013-04-27 MED ORDER — IOHEXOL 180 MG/ML  SOLN
1.0000 mL | Freq: Once | INTRAMUSCULAR | Status: AC | PRN
  Administered 2013-04-27: 1 mL via EPIDURAL

## 2013-05-12 ENCOUNTER — Encounter: Payer: Self-pay | Admitting: Neurology

## 2013-05-12 ENCOUNTER — Ambulatory Visit (INDEPENDENT_AMBULATORY_CARE_PROVIDER_SITE_OTHER): Payer: Medicare Other | Admitting: Neurology

## 2013-05-12 VITALS — BP 111/68 | HR 67 | Ht 70.0 in | Wt 194.0 lb

## 2013-05-12 DIAGNOSIS — R269 Unspecified abnormalities of gait and mobility: Secondary | ICD-10-CM | POA: Insufficient documentation

## 2013-05-12 DIAGNOSIS — G219 Secondary parkinsonism, unspecified: Secondary | ICD-10-CM

## 2013-05-12 DIAGNOSIS — D518 Other vitamin B12 deficiency anemias: Secondary | ICD-10-CM

## 2013-05-12 DIAGNOSIS — R6889 Other general symptoms and signs: Secondary | ICD-10-CM

## 2013-05-12 HISTORY — DX: Unspecified abnormalities of gait and mobility: R26.9

## 2013-05-12 HISTORY — DX: Secondary parkinsonism, unspecified: G21.9

## 2013-05-12 MED ORDER — QUETIAPINE FUMARATE 50 MG PO TABS: 50.0000 mg | ORAL_TABLET | Freq: Every day | ORAL | Status: DC

## 2013-05-12 NOTE — Progress Notes (Signed)
Reason for visit: Gait disorder  Clayton Burke is a 72 y.o. male  History of present illness:  Clayton Burke is a 72 year old right-handed white male with a history of a gait disorder that began approximately 5 or 6 months ago. The patient indicates that 18 months ago, he was placed on low-dose Abilify, currently taking 2.5 mg at night. The patient indicates that he is shuffling his feet, and he is having difficulty getting up out of a chair. The patient has also noted that he is having problems with drooling. The patient denies any changes in his speech or swallowing. The patient has noted occasional tremors in both upper extremities. The patient denies any numbness of the extremities, but he feels as if his arms and legs are somewhat fatigable. The patient does have some low back pain issues. The patient denies any problems with going up stairs. The patient denies problems controlling the bowels or the bladder, and he denies any significant memory issues. The patient has a long-standing history of compensated hydrocephalus, and a recent MRI the brain was done that did not show any progression in hydrocephalus. The patient is sent to this office for an evaluation.  Past Medical History  Diagnosis Date  . HTN (hypertension)   . Migraine   . ADD (attention deficit disorder)   . Gout   . Bipolar 1 disorder   . DDD (degenerative disc disease), cervical   . Diverticulosis   . BPH (benign prostatic hyperplasia)   . Hyperlipidemia   . PVC (premature ventricular contraction)   . LBBB (left bundle branch block)   . Radiculopathy     lumbar with right leg pain  . Zoster   . Depression   . 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 Abnormality of gait 05/12/2013  . Hydrocephalus, communicating     Past Surgical History  Procedure Laterality Date  . Tonsillectomy and adenoidectomy    . Cataract extraction    . Belpharoptosis repair      Family History  Problem Relation Age of Onset  . Hypertension  Father   . Stroke Father   . Bipolar disorder Brother   . Uterine cancer Mother   . Parkinsonism Maternal Uncle     Social history:  reports that he quit smoking about 35 years ago. His smoking use included Cigarettes. He has a 24 pack-year smoking history. He has never used smokeless tobacco. He reports that he does not drink alcohol or use illicit drugs.  Medications:  Current Outpatient Prescriptions on File Prior to Visit  Medication Sig Dispense Refill  . aspirin 81 MG tablet Take 81 mg by mouth daily.      Marland Kitchen buPROPion (WELLBUTRIN XL) 300 MG 24 hr tablet Take 300 mg by mouth every morning.       . finasteride (PROSCAR) 5 MG tablet Take 5 mg by mouth daily.       Marland Kitchen FLUoxetine (PROZAC) 20 MG capsule Take 20 mg by mouth daily.      Marland Kitchen HYDROcodone-acetaminophen (VICODIN) 5-500 MG per tablet Take 1 tablet by mouth every 6 (six) hours as needed for pain. 1-2 DAILY      . lamoTRIgine (LAMICTAL) 100 MG tablet Take 100 mg by mouth 2 (two) times daily.      Marland Kitchen losartan (COZAAR) 100 MG tablet Take 100 mg by mouth daily.      Marland Kitchen lovastatin (MEVACOR) 20 MG tablet Take 20 mg by mouth at bedtime.       Marland Kitchen  Multiple Vitamin (MULTIVITAMIN) tablet Take 1 tablet by mouth daily.      . propranolol (INDERAL) 10 MG tablet Take 10 mg by mouth daily.       . tamsulosin (FLOMAX) 0.4 MG CAPS Take 0.4 mg by mouth 2 (two) times daily.       No current facility-administered medications on file prior to visit.    Allergies: Not on File  ROS:  Out of a complete 14 system review of symptoms, the patient complains only of the following symptoms, and all other reviewed systems are negative.  Palpitations of the heart Hearing loss Runny nose Depression, insomnia  Blood pressure 111/68, pulse 67, height 5\' 10"  (1.778 m), weight 194 lb (87.998 kg).  Physical Exam  General: The patient is alert and cooperative at the time of the examination. Mild masking of the face is noted.  Head: Pupils are equal, round,  and reactive to light. Discs are flat bilaterally.  Neck: The neck is supple, no carotid bruits are noted.  Respiratory: The respiratory examination is clear.  Cardiovascular: The cardiovascular examination reveals a regular rate and rhythm, no obvious murmurs or rubs are noted.  Skin: Extremities are without significant edema.  Neurologic Exam  Mental status:  Cranial nerves: Facial symmetry is present. There is good sensation of the face to pinprick and soft touch bilaterally. The strength of the facial muscles and the muscles to head turning and shoulder shrug are normal bilaterally. Speech is well enunciated, no aphasia or dysarthria is noted. Extraocular movements are full. Visual fields are full.  Motor: The motor testing reveals 5 over 5 strength of all 4 extremities. Good symmetric motor tone is noted throughout. The patient is able to arise from a seated position with arms crossed.  Sensory: Sensory testing is intact to pinprick, soft touch, vibration sensation, and position sense on all 4 extremities. No evidence of extinction is noted.  Coordination: Cerebellar testing reveals good finger-nose-finger and heel-to-shin bilaterally.  Gait and station: Gait is noted for a slightly stooped posture, and decreased arm swing on the right. Tandem gait is normal. Romberg is negative. No drift is seen.  Reflexes: Deep tendon reflexes are symmetric and normal bilaterally. Toes are downgoing bilaterally.   Assessment/Plan:  1. Gait disorder, parkinsonism  2. Hydrocephalus, stable  The patient has a significant problem with bipolar disorder, and Abilify has been effective for treating his depression. In the past, Seroquel was not effective. The patient however, is developing symptoms of parkinsonism. The patient is on a relatively low dose of Abilify, and this would increase the chances that he truly does have Parkinson's disease. Normal pressure hydrocephalus does need to be  considered, but I think that this is less likely. The patient will be sent for blood work today. The patient will come off of the Abilify, and Seroquel will be reinstituted, and the dose will need to be increased if needed. The patient will followup in 3 months. If the patient continues to progress with his symptoms, medications for Parkinson's disease will be initiated. If needed, a  DAT PET scan may be helpful to differentiate secondary parkinsonism from Parkinson's disease.   Marlan Palau MD 05/12/2013 7:35 PM  [facility] [address] [place], [place] [address]  Phone [phone] Fax [phone]

## 2013-05-13 LAB — RPR: RPR: NONREACTIVE

## 2013-05-13 LAB — TSH: TSH: 3.06 u[IU]/mL (ref 0.450–4.500)

## 2013-05-17 NOTE — Progress Notes (Signed)
Quick Note:  Spoke with patient and relayed unremarkable blood work results, per Dr. Anne Hahn. ______

## 2013-05-20 ENCOUNTER — Encounter: Payer: Medicare Other | Admitting: Cardiology

## 2013-05-21 ENCOUNTER — Ambulatory Visit: Payer: Medicare Other | Admitting: Cardiovascular Disease

## 2013-05-28 ENCOUNTER — Telehealth: Payer: Self-pay | Admitting: Neurology

## 2013-05-28 NOTE — Telephone Encounter (Signed)
I called patient. The patient is having some issues with Seroquel. He can get to sleep, but then he wakes up 2 or 3 hours later with a restless type feeling, akathisia. He is already on 2 other antidepressant medications. If the patient cannot tolerate the medication during the daytime, we may need to switch back to Abilify.

## 2013-05-31 ENCOUNTER — Telehealth: Payer: Self-pay | Admitting: Neurology

## 2013-05-31 NOTE — Telephone Encounter (Signed)
I called patient. He cannot tolerate Seroquel. He will have to go back on Abilify, 2.5 mg at night. The patient indicates that his walking and tremors seemed to improve off of the medication. We will follow the patient for increasing parkinsonian features.

## 2013-06-02 ENCOUNTER — Other Ambulatory Visit: Payer: Self-pay

## 2013-06-16 ENCOUNTER — Telehealth: Payer: Self-pay | Admitting: Neurology

## 2013-06-16 NOTE — Telephone Encounter (Signed)
Pt called wants Dr. Anne Hahn to know that the medication Seroquel is working with no problems and has stopped taking Abilify, pt is doing well from the switch. Thanks

## 2013-06-16 NOTE — Telephone Encounter (Signed)
Information noted. The patient will remain on Seroquel 50 mg at night.

## 2013-07-26 ENCOUNTER — Other Ambulatory Visit: Payer: Self-pay | Admitting: Family Medicine

## 2013-07-26 DIAGNOSIS — M549 Dorsalgia, unspecified: Secondary | ICD-10-CM

## 2013-08-03 ENCOUNTER — Other Ambulatory Visit: Payer: Medicare Other

## 2013-08-06 ENCOUNTER — Encounter: Payer: Medicare Other | Admitting: Cardiology

## 2013-09-02 ENCOUNTER — Other Ambulatory Visit: Payer: Self-pay

## 2013-09-07 ENCOUNTER — Encounter: Payer: Medicare Other | Admitting: Cardiology

## 2013-09-10 ENCOUNTER — Ambulatory Visit
Admission: RE | Admit: 2013-09-10 | Discharge: 2013-09-10 | Disposition: A | Discharge status: Home / Self Care | Payer: Medicare Other | Source: Ambulatory Visit | Attending: Family Medicine | Admitting: Family Medicine

## 2013-09-10 DIAGNOSIS — M549 Dorsalgia, unspecified: Secondary | ICD-10-CM

## 2013-09-10 MED ORDER — METHYLPREDNISOLONE ACETATE 40 MG/ML INJ SUSP (RADIOLOG
120.0000 mg | Freq: Once | INTRAMUSCULAR | Status: AC
  Administered 2013-09-10: 120 mg via EPIDURAL

## 2013-09-10 MED ORDER — IOHEXOL 180 MG/ML  SOLN
1.0000 mL | Freq: Once | INTRAMUSCULAR | Status: AC | PRN
  Administered 2013-09-10: 1 mL via EPIDURAL

## 2013-09-29 ENCOUNTER — Other Ambulatory Visit: Payer: Self-pay | Admitting: Family Medicine

## 2013-09-29 DIAGNOSIS — M549 Dorsalgia, unspecified: Secondary | ICD-10-CM

## 2013-10-06 ENCOUNTER — Ambulatory Visit
Admission: RE | Admit: 2013-10-06 | Discharge: 2013-10-06 | Disposition: A | Discharge status: Home / Self Care | Payer: Medicare Other | Source: Ambulatory Visit | Attending: Family Medicine | Admitting: Family Medicine

## 2013-10-06 VITALS — BP 130/56 | HR 71

## 2013-10-06 DIAGNOSIS — M549 Dorsalgia, unspecified: Secondary | ICD-10-CM

## 2013-10-06 MED ORDER — IOHEXOL 180 MG/ML  SOLN
1.0000 mL | Freq: Once | INTRAMUSCULAR | Status: AC | PRN
  Administered 2013-10-06: 1 mL via EPIDURAL

## 2013-10-06 MED ORDER — METHYLPREDNISOLONE ACETATE 40 MG/ML INJ SUSP (RADIOLOG
120.0000 mg | Freq: Once | INTRAMUSCULAR | Status: AC
  Administered 2013-10-06: 120 mg via EPIDURAL

## 2013-11-09 ENCOUNTER — Other Ambulatory Visit: Payer: Self-pay | Admitting: Family Medicine

## 2013-11-09 ENCOUNTER — Ambulatory Visit
Admission: RE | Admit: 2013-11-09 | Discharge: 2013-11-09 | Disposition: A | Discharge status: Home / Self Care | Payer: Medicare Other | Source: Ambulatory Visit | Attending: Family Medicine | Admitting: Family Medicine

## 2013-11-09 DIAGNOSIS — M25511 Pain in right shoulder: Secondary | ICD-10-CM

## 2013-11-29 ENCOUNTER — Other Ambulatory Visit: Payer: Self-pay | Admitting: Neurology

## 2013-11-30 ENCOUNTER — Ambulatory Visit (INDEPENDENT_AMBULATORY_CARE_PROVIDER_SITE_OTHER): Payer: Medicare Other | Admitting: Neurology

## 2013-11-30 ENCOUNTER — Encounter: Payer: Self-pay | Admitting: Neurology

## 2013-11-30 VITALS — BP 96/61 | HR 68 | Wt 199.0 lb

## 2013-11-30 DIAGNOSIS — G219 Secondary parkinsonism, unspecified: Secondary | ICD-10-CM

## 2013-11-30 NOTE — Progress Notes (Signed)
Reason for visit: Secondary parkinsonism  Clayton Burke is an 73 y.o. male  History of present illness:  Clayton Burke is a 73 year old right-handed white male with a history of problems with walking with Parkinson's features. The patient has been on Abilify taking low-dose of 2.5 mg daily. The patient went off of Abilify, and he believes that his walking has improved after several weeks. The patient id doing much better with his balance, and he has not had any recent falls. On the medication, the patient was falling while going upstairs. The patient felt as if his body were ahead of his feet. The patient has had an increase in the Seroquel taking 75 mg at night. The patient returns for an evaluation. No other issues,. The patient denies any problems with swallowing or problems with tremor.  Past Medical History  Diagnosis Date  . HTN (hypertension)   . Migraine   . ADD (attention deficit disorder)   . Gout   . Bipolar 1 disorder   . DDD (degenerative disc disease), cervical   . Diverticulosis   . BPH (benign prostatic hyperplasia)   . Hyperlipidemia   . PVC (premature ventricular contraction)   . LBBB (left bundle branch block)   . Radiculopathy     lumbar with right leg pain  . Zoster   . Depression   . 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 Abnormality of gait 05/12/2013  . Hydrocephalus, communicating     Past Surgical History  Procedure Laterality Date  . Tonsillectomy and adenoidectomy    . Cataract extraction    . Belpharoptosis repair      Family History  Problem Relation Age of Onset  . Hypertension Father   . Stroke Father   . Bipolar disorder Brother   . Uterine cancer Mother   . Parkinsonism Maternal Uncle     Social history:  reports that he quit smoking about 36 years ago. His smoking use included Cigarettes. He has a 24 pack-year smoking history. He has never used smokeless tobacco. He reports that he does not drink alcohol or use illicit drugs.      Allergies  Allergen Reactions  . Seroquel [Quetiapine Fumarate]     Restlessness    Medications:  Current Outpatient Prescriptions on File Prior to Visit  Medication Sig Dispense Refill  . aspirin 81 MG tablet Take 81 mg by mouth daily.      Marland Kitchen. buPROPion (WELLBUTRIN XL) 300 MG 24 hr tablet Take 300 mg by mouth every morning.       Marland Kitchen. FLUoxetine (PROZAC) 20 MG capsule Take 20 mg by mouth daily.      . Multiple Vitamin (MULTIVITAMIN) tablet Take 1 tablet by mouth daily.      . propranolol (INDERAL) 10 MG tablet Take 10 mg by mouth daily.       . QUEtiapine (SEROQUEL) 50 MG tablet Take 50 mg by mouth at bedtime.      . tamsulosin (FLOMAX) 0.4 MG CAPS Take 0.4 mg by mouth 2 (two) times daily.      . QUEtiapine (SEROQUEL) 50 MG tablet take 1 tablet by mouth once daily at bedtime  30 tablet  6   No current facility-administered medications on file prior to visit.    ROS:  Out of a complete 14 system review of symptoms, the patient complains only of the following symptoms, and all other reviewed systems are negative.  Restless leg Insomnia Back pain Memory loss  Depression  Blood pressure  96/61, pulse 68, weight 199 lb (90.266 kg).  Physical Exam  General: The patient is alert and cooperative at the time of the examination.  Skin: No significant peripheral edema is noted.   Neurologic Exam  Mental status: The patient is oriented x 3. Mini-Mental status examination done today to shows a total score of 30/30.  Cranial nerves: Facial symmetry is present. Speech is normal, no aphasia or dysarthria is noted. Extraocular movements are full. Visual fields are full. Masking of the face is seen.  Motor: The patient has good strength in all 4 extremities.  Sensory examination: Soft touch sensation on the face, arms, and legs is symmetric.  Coordination: The patient has good finger-nose-finger and heel-to-shin bilaterally.  Gait and station: The patient has a normal gait. The  patient is able to arise from a seated position with arms crossed. With ambulation, the patient takes good stride, with good bilateral arm swing. Tandem gait is normal. Romberg is negative. No drift is seen.  Reflexes: Deep tendon reflexes are symmetric.   Assessment/Plan:  1. 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 Mild hydrocephalus  The patient has had symptoms of Parkinson's disease while on Abilify. The patient is off the medication at this point, and he is walking much better. The patient continues to have masking of the face, however. The patient very well may have true Parkinson's disease. Given the various issues with medication, and hydrocephalus, we will consider a DAT scan to determine whether the patient truly does have Parkinson's disease. The patient will followup in 5 months.  Marlan Palau MD 11/30/2013 7:37 PM  [facility] [address] [place], [place] [address]  Phone [phone] Fax [phone]

## 2013-12-02 ENCOUNTER — Telehealth: Payer: Self-pay | Admitting: Neurology

## 2013-12-02 NOTE — Telephone Encounter (Signed)
Pt called in and stated he forgot to mention at his appointment that his father had hydrocephalus and had mini-strokes.  His father died from a stroke at age 476.  He wanted to make Dr. Anne HahnWillis aware of this as this was one of the things they spoke of during the appointment.  Please call.  Thank you

## 2013-12-02 NOTE — Telephone Encounter (Signed)
I called patient. I discussed with family history, the father also had hydrocephalus. Hydrocephalus can be inherited.

## 2013-12-21 ENCOUNTER — Other Ambulatory Visit: Payer: Self-pay | Admitting: Family Medicine

## 2013-12-21 ENCOUNTER — Ambulatory Visit
Admission: RE | Admit: 2013-12-21 | Discharge: 2013-12-21 | Disposition: A | Discharge status: Home / Self Care | Payer: Medicare Other | Source: Ambulatory Visit | Attending: Family Medicine | Admitting: Family Medicine

## 2013-12-21 DIAGNOSIS — M79674 Pain in right toe(s): Secondary | ICD-10-CM

## 2013-12-30 ENCOUNTER — Telehealth: Payer: Self-pay | Admitting: Neurology

## 2013-12-30 NOTE — Telephone Encounter (Signed)
I called patient. The DAT scan [facility] is unremarkable, normal. No evidence of true Parkinson's disease. The symptoms of parkinsonism may be related to the use of Abilify.

## 2014-01-26 ENCOUNTER — Telehealth: Payer: Self-pay | Admitting: Neurology

## 2014-01-26 MED ORDER — TETRABENAZINE 12.5 MG PO TABS: ORAL_TABLET | ORAL | Status: DC

## 2014-01-26 NOTE — Telephone Encounter (Signed)
Patient is clinching his jaw and walking more flat footed--he is wondering if this could be [facility]please call-thank you.

## 2014-01-26 NOTE — Telephone Encounter (Signed)
I called patient. Off of Abilify, on Seroquel, the patient has an obsessive tendency to collapse down on the teeth. I would wonder this is some form of a tardive movement issue. I'll give the patient a trial on Xenazine. The patient also indicates that he walks more flat-footed, not sure what this is secondary to, will need to see him ambulate in person.

## 2014-02-02 ENCOUNTER — Encounter: Payer: Self-pay | Admitting: Neurology

## 2014-02-04 ENCOUNTER — Ambulatory Visit (INDEPENDENT_AMBULATORY_CARE_PROVIDER_SITE_OTHER): Payer: Medicare Other | Admitting: Neurology

## 2014-02-04 ENCOUNTER — Encounter: Payer: Self-pay | Admitting: Neurology

## 2014-02-04 VITALS — BP 116/65 | HR 71 | Ht 69.0 in | Wt 200.0 lb

## 2014-02-04 DIAGNOSIS — R269 Unspecified abnormalities of gait and mobility: Secondary | ICD-10-CM

## 2014-02-04 DIAGNOSIS — G219 Secondary parkinsonism, unspecified: Secondary | ICD-10-CM

## 2014-02-04 MED ORDER — QUETIAPINE FUMARATE 100 MG PO TABS: 100.0000 mg | ORAL_TABLET | Freq: Every day | ORAL | Status: DC

## 2014-02-04 NOTE — Patient Instructions (Signed)

## 2014-02-04 NOTE — Progress Notes (Signed)
Reason for visit: Secondary parkinsonism  Clayton Burke is an 73 y.o. male  History of present illness:  Clayton Burke is a 73 year old right-handed white male with a history of secondary parkinsonism. The patient was on Abilify for bipolar disorder which was very effective for his depression. The patient has come off of Abilify, and he was switched to Seroquel, currently on 75 mg at night. The patient is tolerating the medication well, but he has had some worsening of his depression. The patient has developed some issues with uncontrollable grinding of his teeth. The patient indicates that this was a significant problem initially, resulting in fracturing of the teeth. The patient indicates this is gradually improving. The patient also has had some alteration of walking, indicating that he feels as if his feet are slapping on the ground. The patient indicates that he feels as if he is taking shorter steps. The patient has undergone a DAT scan that was normal, suggesting that he does not have true Parkinson's disease. The patient returns to this office for an evaluation. The patient has had one fall, but this occurred outside on snow.  Past Medical History  Diagnosis Date  . HTN (hypertension)   . Migraine   . ADD (attention deficit disorder)   . Gout   . Bipolar 1 disorder   . DDD (degenerative disc disease), cervical   . Diverticulosis   . BPH (benign prostatic hyperplasia)   . Hyperlipidemia   . PVC (premature ventricular contraction)   . LBBB (left bundle branch block)   . Radiculopathy     lumbar with right leg pain  . Zoster   . Depression   . 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 Abnormality of gait 05/12/2013  . Hydrocephalus, communicating     Past Surgical History  Procedure Laterality Date  . Tonsillectomy and adenoidectomy    . Cataract extraction    . Belpharoptosis repair      Family History  Problem Relation Age of Onset  . Hypertension Father   . Stroke Father   .  Bipolar disorder Brother   . Uterine cancer Mother   . Parkinsonism Maternal Uncle     Social history:  reports that he quit smoking about 36 years ago. His smoking use included Cigarettes. He has a 24 pack-year smoking history. He has never used smokeless tobacco. He reports that he does not drink alcohol or use illicit drugs.    Allergies  Allergen Reactions  . Seroquel [Quetiapine Fumarate]     Restlessness    Medications:  Current Outpatient Prescriptions on File Prior to Visit  Medication Sig Dispense Refill  . ALPRAZolam (XANAX) 0.5 MG tablet Take 0.5 mg by mouth at bedtime.      Marland Kitchen. aspirin 81 MG tablet Take 81 mg by mouth daily.      Marland Kitchen. buPROPion (WELLBUTRIN XL) 300 MG 24 hr tablet Take 300 mg by mouth every morning.       . finasteride (PROSCAR) 5 MG tablet Take 5 mg by mouth daily.      Marland Kitchen. FLUoxetine (PROZAC) 20 MG capsule Take 20 mg by mouth daily.      Marland Kitchen. HYDROcodone-acetaminophen (NORCO/VICODIN) 5-325 MG per tablet Take 1 tablet by mouth 2 (two) times daily.      Marland Kitchen. lamoTRIgine (LAMICTAL) 100 MG tablet Take 100 mg by mouth 2 (two) times daily.       Marland Kitchen. losartan (COZAAR) 100 MG tablet Take 100 mg by mouth daily.      .Marland Kitchen  lovastatin (MEVACOR) 20 MG tablet Take 20 mg by mouth once.      . Multiple Vitamin (MULTIVITAMIN) tablet Take 1 tablet by mouth daily.      . propranolol (INDERAL) 10 MG tablet Take 10 mg by mouth daily.       . tamsulosin (FLOMAX) 0.4 MG CAPS Take 0.4 mg by mouth 2 (two) times daily.       No current facility-administered medications on file prior to visit.    ROS:  Out of a complete 14 system review of symptoms, the patient complains only of the following symptoms, and all other reviewed systems are negative.  Insomnia Weakness Depression  Blood pressure 116/65, pulse 71, height 5\' 9"  (1.753 m), weight 200 lb (90.719 kg).  Physical Exam  General: The patient is alert and cooperative at the time of the examination.  Skin: No significant peripheral  edema is noted.   Neurologic Exam  Mental status: The patient is oriented x 3.  Cranial nerves: Facial symmetry is present. Speech is normal, no aphasia or dysarthria is noted. Extraocular movements are full. Visual fields are full. Mild masking of the face is seen.  Motor: The patient has good strength in all 4 extremities. The patient is able walk on heels and the toes.  Sensory examination: Soft touch sensation is symmetric on the face, arms, and legs.  Coordination: The patient has good finger-nose-finger and heel-to-shin bilaterally.  Gait and station: The patient has a normal gait. Tandem gait is normal. With walking, the patient has a mild reduction in arm swing, right greater than left. Romberg is negative. No drift is seen.  Reflexes: Deep tendon reflexes are symmetric.   Assessment/Plan:  1. Secondary parkinsonism  2. Communicating hydrocephalus  The fact that the DAT scan was normal is reassuring that the patient may not have true Parkinson's disease. The patient indicates that he feels as if his feet are slapping the ground, but his gait appears to be relatively unremarkable today. The patient has a normal heel strike with walking and he has good stride. The patient still has some mild masking of the face, and some mild decrease in arm swing, right greater than left. The grinding movements of the teeth may have represented a tardive movement disorder, but this appears to be improving over time. We will watch the patient conservatively at this point. The Seroquel dose will be increased to 100 mg at night. The patient will followup in 6 months. If the walking continues to deteriorate, a repeat CT scan to evaluate the hydrocephalus would be indicated.  Marlan Palau MD 02/05/2014 1:30 PM  [facility] [address] [place], [place] [address]  Phone [phone] Fax [phone]

## 2014-03-08 ENCOUNTER — Encounter: Payer: Self-pay | Admitting: Neurology

## 2014-03-08 ENCOUNTER — Telehealth: Payer: Self-pay | Admitting: Neurology

## 2014-03-08 NOTE — Telephone Encounter (Signed)
Called patient about rescheduling 08/01/14 appointment per Dr Anne HahnWillis schedule, phone kept ringing with no voicemail option. Printed and sent letter with new appointment time.

## 2014-03-11 ENCOUNTER — Telehealth: Payer: Self-pay | Admitting: Neurology

## 2014-03-11 MED ORDER — FLUOXETINE HCL 40 MG PO CAPS: 40.0000 mg | ORAL_CAPSULE | Freq: Every day | ORAL | Status: DC

## 2014-03-11 NOTE — Telephone Encounter (Signed)
Patient calling requesting a possible change from  QUEtiapine (SEROQUEL) 100 MG tablet, due to side effects of clenching jaw involuntarily and blinking of eyes compulsively.  Please call and advise

## 2014-03-11 NOTE — Telephone Encounter (Signed)
Pt calling requesting if there is something else pt could take because of the side effects pt is experiencing while taking Seroquel. Please advise

## 2014-03-11 NOTE — Telephone Encounter (Signed)
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 He is clenching his teeth and blinking his eyes since being on the Seroquel. We will try going up on the Prozac taking 40 mg a day, and in the future he may require 60 mg daily. The depression is not as well controlled on Seroquel as it was on Abilify.

## 2014-03-24 ENCOUNTER — Telehealth: Payer: Self-pay | Admitting: Neurology

## 2014-03-24 DIAGNOSIS — F32A Depression, unspecified: Secondary | ICD-10-CM

## 2014-03-24 DIAGNOSIS — F329 Major depressive disorder, single episode, unspecified: Secondary | ICD-10-CM

## 2014-03-24 NOTE — Telephone Encounter (Signed)
Spouse called to inform Dr Anne Hahn, desvenlafaxine (PRISTIQ) 50 MG 24 hr tablet isn't working. Patient wants to sleep all day and getting very depressed. Questioning if needing to increase dosage. Please call and advise   Last office visit 02/04/14

## 2014-03-24 NOTE — Telephone Encounter (Signed)
Patient calling to state that he would like to discuss Abilify, since he states his depression is returning. Please call and advise patient.

## 2014-03-24 NOTE — Telephone Encounter (Signed)
I called patient. He is still having some issues with depression, and some OCD behavior. The patient no longer has a psychiatrist, we will have to make a referral. The patient will go up on the Prozac to 60 mg daily.

## 2014-04-05 ENCOUNTER — Telehealth: Payer: Self-pay | Admitting: Neurology

## 2014-04-05 NOTE — Telephone Encounter (Signed)
Patient calling to check on the status of the psychiatrist referral. Please return call and advise.

## 2014-04-05 NOTE — Telephone Encounter (Signed)
Patient given Dr. Donell Beers [phone]. Explained the Referral has been submitted but the patient has to actually call to schedule appointment.

## 2014-04-11 ENCOUNTER — Other Ambulatory Visit: Payer: Self-pay | Admitting: Family Medicine

## 2014-04-11 DIAGNOSIS — M549 Dorsalgia, unspecified: Secondary | ICD-10-CM

## 2014-04-14 ENCOUNTER — Ambulatory Visit
Admission: RE | Admit: 2014-04-14 | Discharge: 2014-04-14 | Disposition: A | Discharge status: Home / Self Care | Payer: Medicare Other | Source: Ambulatory Visit | Attending: Family Medicine | Admitting: Family Medicine

## 2014-04-14 DIAGNOSIS — M549 Dorsalgia, unspecified: Secondary | ICD-10-CM

## 2014-04-14 MED ORDER — METHYLPREDNISOLONE ACETATE 40 MG/ML INJ SUSP (RADIOLOG
120.0000 mg | Freq: Once | INTRAMUSCULAR | Status: AC
  Administered 2014-04-14: 120 mg via EPIDURAL

## 2014-04-14 MED ORDER — IOHEXOL 180 MG/ML  SOLN
1.0000 mL | Freq: Once | INTRAMUSCULAR | Status: AC | PRN
  Administered 2014-04-14: 1 mL via EPIDURAL

## 2014-04-14 NOTE — Discharge Instructions (Signed)

## 2014-04-21 ENCOUNTER — Other Ambulatory Visit: Payer: Self-pay | Admitting: Family Medicine

## 2014-04-21 DIAGNOSIS — M25511 Pain in right shoulder: Secondary | ICD-10-CM

## 2014-04-21 DIAGNOSIS — M545 Low back pain, unspecified: Secondary | ICD-10-CM

## 2014-05-04 ENCOUNTER — Other Ambulatory Visit: Payer: Medicare Other

## 2014-05-13 ENCOUNTER — Ambulatory Visit
Admission: RE | Admit: 2014-05-13 | Discharge: 2014-05-13 | Disposition: A | Discharge status: Home / Self Care | Payer: Medicare Other | Source: Ambulatory Visit | Attending: Family Medicine | Admitting: Family Medicine

## 2014-05-13 ENCOUNTER — Other Ambulatory Visit: Payer: Self-pay | Admitting: Family Medicine

## 2014-05-13 VITALS — BP 109/66 | HR 86

## 2014-05-13 DIAGNOSIS — M545 Low back pain, unspecified: Secondary | ICD-10-CM

## 2014-05-13 MED ORDER — METHYLPREDNISOLONE ACETATE 40 MG/ML INJ SUSP (RADIOLOG
120.0000 mg | Freq: Once | INTRAMUSCULAR | Status: AC
  Administered 2014-05-13: 120 mg via EPIDURAL

## 2014-05-13 MED ORDER — IOHEXOL 180 MG/ML  SOLN
1.0000 mL | Freq: Once | INTRAMUSCULAR | Status: AC | PRN
  Administered 2014-05-13: 1 mL via EPIDURAL

## 2014-05-31 ENCOUNTER — Ambulatory Visit: Payer: Medicare Other | Admitting: Neurology

## 2014-06-10 ENCOUNTER — Ambulatory Visit (HOSPITAL_COMMUNITY)
Admission: RE | Admit: 2014-06-10 | Discharge: 2014-06-10 | Disposition: A | Discharge status: Home / Self Care | Payer: Medicare Other | Source: Ambulatory Visit | Attending: Vascular Surgery | Admitting: Vascular Surgery

## 2014-06-10 ENCOUNTER — Other Ambulatory Visit (HOSPITAL_COMMUNITY): Payer: Self-pay | Admitting: Family Medicine

## 2014-06-10 DIAGNOSIS — I872 Venous insufficiency (chronic) (peripheral): Secondary | ICD-10-CM | POA: Insufficient documentation

## 2014-06-10 DIAGNOSIS — R609 Edema, unspecified: Secondary | ICD-10-CM

## 2014-06-10 NOTE — Progress Notes (Signed)
[facility] Dr. Geoffery LyonsBlomgren's office.

## 2014-06-15 ENCOUNTER — Other Ambulatory Visit: Payer: Self-pay | Admitting: Orthopedic Surgery

## 2014-06-15 DIAGNOSIS — M48061 Spinal stenosis, lumbar region without neurogenic claudication: Secondary | ICD-10-CM

## 2014-06-15 DIAGNOSIS — M4714 Other spondylosis with myelopathy, thoracic region: Secondary | ICD-10-CM

## 2014-06-19 ENCOUNTER — Ambulatory Visit
Admission: RE | Admit: 2014-06-19 | Discharge: 2014-06-19 | Disposition: A | Discharge status: Home / Self Care | Payer: Medicare Other | Source: Ambulatory Visit | Attending: Orthopedic Surgery | Admitting: Orthopedic Surgery

## 2014-06-19 DIAGNOSIS — M48061 Spinal stenosis, lumbar region without neurogenic claudication: Secondary | ICD-10-CM

## 2014-06-19 DIAGNOSIS — M4714 Other spondylosis with myelopathy, thoracic region: Secondary | ICD-10-CM

## 2014-08-01 ENCOUNTER — Ambulatory Visit: Payer: Medicare Other | Admitting: Neurology

## 2014-08-04 ENCOUNTER — Encounter: Payer: Self-pay | Admitting: Neurology

## 2014-08-04 ENCOUNTER — Ambulatory Visit (INDEPENDENT_AMBULATORY_CARE_PROVIDER_SITE_OTHER): Payer: Medicare Other | Admitting: Neurology

## 2014-08-04 VITALS — BP 114/72 | HR 64 | Ht 70.0 in | Wt 206.8 lb

## 2014-08-04 DIAGNOSIS — G219 Secondary parkinsonism, unspecified: Secondary | ICD-10-CM

## 2014-08-04 DIAGNOSIS — R269 Unspecified abnormalities of gait and mobility: Secondary | ICD-10-CM

## 2014-08-04 NOTE — Progress Notes (Signed)
Reason for visit: Secondary parkinsonism   Clayton Burke is an 73 y.o. male  History of present illness:  Clayton Burke is a 73 year old right-handed white male with a history of secondary parkinsonism. He has a history of bipolar disorder, and he was placed on a very low dose of Abilify at 2.5 mg daily. The patient underwent a DAT scan that was normal. 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 The patient was seen by Dr. Donell BeersPlovsky, and he was placed back on Abilify. The patient is off of Seroquel, and off of Prozac, and he is now on Wellbutrin. The patient is doing better at this point, and he indicates that on Abilify his walking has not deteriorated. The patient has not had any falls. The patient has had improvement in the tardive movements. He denies any issues controlling the bowels or the bladder. The patient has had a recent recurrence of low back pain and left-sided sciatica that has improved with an epidural steroid injection. He has undergone a recent MRI of the lumbosacral spine.  Past Medical History  Diagnosis Date  . HTN (hypertension)   . Migraine   . ADD (attention deficit disorder)   . Gout   . Bipolar 1 disorder   . DDD (degenerative disc disease), cervical   . Diverticulosis   . BPH (benign prostatic hyperplasia)   . Hyperlipidemia   . PVC (premature ventricular contraction)   . LBBB (left bundle branch block)   . Radiculopathy     lumbar with right leg pain  . Zoster   . Depression   . 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 Abnormality of gait 05/12/2013  . Hydrocephalus, communicating     Past Surgical History  Procedure Laterality Date  . Tonsillectomy and adenoidectomy    . Cataract extraction    . Belpharoptosis repair      Family History  Problem Relation Age of Onset  .  Hypertension Father   . Stroke Father   . Bipolar disorder Brother   . Uterine cancer Mother   . Parkinsonism Maternal Uncle     Social history:  reports that he quit smoking about 37 years ago. His smoking use included Cigarettes. He has a 24 pack-year smoking history. He has never used smokeless tobacco. He reports that he does not drink alcohol or use illicit drugs.    Allergies  Allergen Reactions  . Seroquel [Quetiapine Fumarate] Other (See Comments)    Restlessness    Medications:  Current Outpatient Prescriptions on File Prior to Visit  Medication Sig Dispense Refill  . ALPRAZolam (XANAX) 0.5 MG tablet Take 0.5 mg by mouth at bedtime.      Marland Kitchen. aspirin 81 MG tablet Take 81 mg by mouth daily.      . finasteride (PROSCAR) 5 MG tablet Take 5 mg by mouth daily.      Marland Kitchen. HYDROcodone-acetaminophen (NORCO/VICODIN) 5-325 MG per tablet Take 1 tablet by mouth 2 (two) times daily.      Marland Kitchen. lamoTRIgine (LAMICTAL) 100 MG tablet Take 100 mg by mouth 2 (two) times daily.       Marland Kitchen. losartan (COZAAR) 100 MG tablet Take 100 mg by mouth daily.      Marland Kitchen. lovastatin (MEVACOR) 20 MG tablet Take 20 mg by mouth once.      .Marland Kitchen  Multiple Vitamin (MULTIVITAMIN) tablet Take 1 tablet by mouth daily.      . propranolol (INDERAL) 10 MG tablet Take 10 mg by mouth daily.       . tamsulosin (FLOMAX) 0.4 MG CAPS Take 0.4 mg by mouth 2 (two) times daily.       No current facility-administered medications on file prior to visit.    ROS:  Out of a complete 14 system review of symptoms, the patient complains only of the following symptoms, and all other reviewed systems are negative.  Depression, anxiety  Blood pressure 114/72, pulse 64, height 5\' 10"  (1.778 m), weight 206 lb 12.8 oz (93.804 kg).  Physical Exam  General: The patient is alert and cooperative at the time of the examination.  Skin: No significant peripheral edema is noted.   Neurologic Exam  Mental status: The patient is oriented x 3.  Cranial  nerves: Facial symmetry is present. Speech is normal, no aphasia or dysarthria is noted. Extraocular movements are full. Visual fields are full.  Motor: The patient has good strength in all 4 extremities.  Sensory examination: Soft touch sensation is symmetric on the face, arms, and legs.  Coordination: The patient has good finger-nose-finger and heel-to-shin bilaterally.  Gait and station: The patient has a normal gait. Tandem gait is slightly unsteady. Romberg is negative. No drift is seen.  Reflexes: Deep tendon reflexes are symmetric.    MRI lumbar 06/19/14:  IMPRESSION:  Mild disc bulging and facet hypertrophy at L4-5 causing mild spinal  stenosis. Interval resolution of left-sided synovial cyst since the  prior MRI.  No new disc protrusion identified.    Assessment/Plan:  1. Secondary parkinsonism  2. Bipolar disorder  The patient will continue his current drug regimen. I will check back one more time in 6 months to reevaluate his parkinsonism issues. Dr. Donell Beers feels that the risk of secondary parkinsonism is less than the possibility of severe depression that may result in suicidal ideation. The patient could be treated with anticholinergic medications if his parkinsonism symptoms return.  Marlan Palau MD 08/04/2014 8:08 PM  [facility] [address] [place], [place] [address]  Phone [phone] Fax [phone]

## 2014-08-04 NOTE — Patient Instructions (Signed)

## 2015-02-03 ENCOUNTER — Encounter: Payer: Self-pay | Admitting: Neurology

## 2015-02-03 ENCOUNTER — Ambulatory Visit (INDEPENDENT_AMBULATORY_CARE_PROVIDER_SITE_OTHER): Payer: Medicare Other | Admitting: Neurology

## 2015-02-03 VITALS — BP 104/59 | HR 65 | Ht 69.0 in | Wt 195.2 lb

## 2015-02-03 DIAGNOSIS — G219 Secondary parkinsonism, unspecified: Secondary | ICD-10-CM

## 2015-02-03 DIAGNOSIS — R269 Unspecified abnormalities of gait and mobility: Secondary | ICD-10-CM

## 2015-02-03 NOTE — Patient Instructions (Signed)

## 2015-02-03 NOTE — Progress Notes (Signed)
Reason for visit:  Secondary parkinsonism  Clayton Burke is an 74 y.o. male  History of present illness:   Clayton Burke is a 74 year old right-handed white male with a history of difficulty with walking , shuffling his feet. The patient is felt to have secondary parkinsonism. He was switched off of Abilify to Seroquel, but this did not treat his underlying depression adequately. The patient was forced to go back on Abilify. 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 The patient is now back on Abilify, he is doing well with this. He indicates that when he exercises and wears tennis shoes, he seems to walk better. The patient reports no falls. He does report some problems with memory, particularly with names for people. He occasionally will go into a room, and not remember why he went in. His overall general memory is still good. The patient returns for further evaluation.  Past Medical History  Diagnosis Date  . HTN (hypertension)   . Migraine   . ADD (attention deficit disorder)   . Gout   . Bipolar 1 disorder   . DDD (degenerative disc disease), cervical   . Diverticulosis   . BPH (benign prostatic hyperplasia)   . Hyperlipidemia   . PVC (premature ventricular contraction)   . LBBB (left bundle branch block)   . Radiculopathy     lumbar with right leg pain  . Zoster   . Depression   . 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 Abnormality of gait 05/12/2013  . Hydrocephalus, communicating     Past Surgical History  Procedure Laterality Date  . Tonsillectomy and adenoidectomy    . Cataract extraction    . Belpharoptosis repair      Family History  Problem Relation Age of Onset  . Hypertension Father   . Stroke Father   . Bipolar disorder Brother   . Uterine cancer Mother   . Parkinsonism Maternal Uncle     Social history:  reports that he quit smoking about 37 years ago. His smoking use included  Cigarettes. He has a 24 pack-year smoking history. He has never used smokeless tobacco. He reports that he does not drink alcohol or use illicit drugs.    Allergies  Allergen Reactions  . Seroquel [Quetiapine Fumarate] Other (See Comments)    Restlessness    Medications:  Prior to Admission medications   Medication Sig Start Date End Date Taking? Authorizing Provider  ALPRAZolam Prudy Feeler) 0.5 MG tablet Take 0.5 mg by mouth at bedtime. 11/06/13  Yes Historical Provider, MD  ARIPiprazole (ABILIFY) 5 MG tablet Take 2.5 mg by mouth daily.  06/02/14  Yes Historical Provider, MD  aspirin 81 MG tablet Take 81 mg by mouth daily.   Yes Historical Provider, MD  buPROPion (BUDEPRION XL) 300 MG 24 hr tablet once daily. 06/04/14  Yes Historical Provider, MD  finasteride (PROSCAR) 5 MG tablet Take 5 mg by mouth daily. 11/06/13  Yes Historical Provider, MD  HYDROcodone-acetaminophen (NORCO/VICODIN) 5-325 MG per tablet Take 1 tablet by mouth 2 (two) times daily.   Yes Historical Provider, MD  Influenza Vac Split High-Dose (FLUZONE HIGH-DOSE) 0.5 ML SUSY Inject 1 each into the muscle once. 07/28/14  Yes Historical Provider, MD  lamoTRIgine (LAMICTAL) 100 MG tablet Take 100 mg by mouth 2 (two) times daily.  09/06/13  Yes Historical Provider, MD  losartan (COZAAR) 100 MG tablet Take 100 mg by mouth daily. 11/06/13  Yes Historical Provider,  MD  lovastatin (MEVACOR) 20 MG tablet Take 20 mg by mouth once. 11/06/13  Yes Historical Provider, MD  Multiple Vitamin (MULTIVITAMIN) tablet Take 1 tablet by mouth daily.   Yes Historical Provider, MD  pregabalin (LYRICA) 50 MG capsule Take 50 mg by mouth daily.   Yes Historical Provider, MD  propranolol (INDERAL) 10 MG tablet Take 10 mg by mouth daily.    Yes Historical Provider, MD  tamsulosin (FLOMAX) 0.4 MG CAPS Take 0.4 mg by mouth 2 (two) times daily.   Yes Historical Provider, MD    ROS:  Out of a complete 14 system review of symptoms, the patient complains only of the  following symptoms, and all other reviewed systems are negative.   Hearing loss  Difficulty urinating  memory loss  Depression  Blood pressure 104/59, pulse 65, height 5\' 9"  (1.753 m), weight 195 lb 3.2 oz (88.542 kg).  Physical Exam  General: The patient is alert and cooperative at the time of the examination.  Skin: No significant peripheral edema is noted.   Neurologic Exam  Mental status: The patient is alert and oriented x 3 at the time of the examination. The patient has apparent normal recent and remote memory, with an apparently normal attention span and concentration ability. MOCA testing reveals a total score 26/30.   Cranial nerves: Facial symmetry is present. Speech is normal, no aphasia or dysarthria is noted. Extraocular movements are full. Visual fields are full. Mild masking of the face is seen.  Motor: The patient has good strength in all 4 extremities.  Sensory examination: Soft touch sensation is symmetric on the face, arms, and legs.  Coordination: The patient has good finger-nose-finger and heel-to-shin bilaterally.  Gait and station: The patient has the ability to stand without assistance, with arms crossed. Once up, the patient walks independently, there is decreased arm swing bilaterally, right greater than left. Tandem gait is normal. Romberg is negative. No drift is seen.  Reflexes: Deep tendon reflexes are symmetric.   Assessment/Plan:   1. Secondary parkinsonism  2. Reported mild memory disturbance  3. Mild gait disturbance    The patient will be followed for the memory issues and for the development of further Parkinson's symptoms. He will follow-up in one year, or sooner if needed. We will have him call if he has concerns. The prior DAT scan was negative.  Marlan Palau. Keith Willis MD 02/05/2015 5:01 PM  [facility] [address] [place], [place] [id number]  Phone [phone] Fax [phone]

## 2015-07-11 ENCOUNTER — Telehealth: Payer: Self-pay | Admitting: Neurology

## 2015-07-11 NOTE — Telephone Encounter (Signed)
I called patient. The patient had an episode where he stood up out of a chair at [employer], his wife felt that he was slightly confused, his legs collapse, it took him a couple minutes to get back up again. The patient does not recall feeling dizzy. He is on Seroquel, making it more likely to have some issues with orthostatic hypotension. If this issue recurs, we may need to see him sooner than the previously scheduled appointment. He has not had any further similar events.

## 2015-07-11 NOTE — Telephone Encounter (Signed)
I called the patient. His legs have gradually been getting weaker. He stated that [place] move his [place]. He said his legs just gave out. He did not hit his head. It took him several minutes to be able to get up. He wanted to let Dr. Anne Hahn know about this change.

## 2015-07-11 NOTE — Telephone Encounter (Signed)
Patient is calling to report that he had a fall last week while [place] the beach.  He was sitting in a lawn chair on the beach and when he tried to stand up his legs just completely gave out and he fell and couldn't get up for about 5 minutes.  This had never happened before.  Please call.

## 2015-07-20 NOTE — Telephone Encounter (Signed)
I called the patient. He continues to have difficulty walking. He feels like his legs are heavy and weak. He states he cannot take big steps. He would like to come in to see Dr. Anne Hahn sooner than his appointment in April. Appointment schedule 9/23 at 12 PM.

## 2015-07-20 NOTE — Telephone Encounter (Signed)
I called patient. We will see him tomorrow in the office for an urgent revisit.

## 2015-07-20 NOTE — Telephone Encounter (Signed)
Patient called. Feels like he needs to be seen soon regarding this issue with his legs. "He's having trouble walking. Feels like it's getting worse".

## 2015-07-21 ENCOUNTER — Ambulatory Visit (INDEPENDENT_AMBULATORY_CARE_PROVIDER_SITE_OTHER): Payer: Medicare Other | Admitting: Neurology

## 2015-07-21 ENCOUNTER — Encounter: Payer: Self-pay | Admitting: Neurology

## 2015-07-21 VITALS — BP 94/55 | HR 68 | Ht 69.0 in | Wt 208.2 lb

## 2015-07-21 DIAGNOSIS — R269 Unspecified abnormalities of gait and mobility: Secondary | ICD-10-CM

## 2015-07-21 DIAGNOSIS — G219 Secondary parkinsonism, unspecified: Secondary | ICD-10-CM

## 2015-07-21 MED ORDER — CARBIDOPA-LEVODOPA 25-100 MG PO TABS: 0.5000 | ORAL_TABLET | Freq: Three times a day (TID) | ORAL | Status: DC

## 2015-07-21 NOTE — Progress Notes (Signed)
Reason for visit: Gait disorder  Clayton Burke is an 74 y.o. male  History of present illness:  Clayton Burke is a 74 year old right-handed white male with a history of parkinsonism. The patient has been on Abilify, but he has been able to come off the medication and he has switched to Seroquel taking 50 mg twice daily. He believes that since being off of Abilify his walking has actually worsened. He feels somewhat weak at the knees bilaterally, he feels as if he is not able to use his legs properly. The patient has fallen on one occasion. He denies any tremors. He denies issues with swallowing. He returns for an evaluation.  Past Medical History  Diagnosis Date  . HTN (hypertension)   . Migraine   . ADD (attention deficit disorder)   . Gout   . Bipolar 1 disorder   . DDD (degenerative disc disease), cervical   . Diverticulosis   . BPH (benign prostatic hyperplasia)   . Hyperlipidemia   . PVC (premature ventricular contraction)   . LBBB (left bundle branch block)   . Radiculopathy     lumbar with right leg pain  . Zoster   . Depression   . 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 Abnormality of gait 05/12/2013  . Hydrocephalus, communicating     Past Surgical History  Procedure Laterality Date  . Tonsillectomy and adenoidectomy    . Cataract extraction    . Belpharoptosis repair      Family History  Problem Relation Age of Onset  . Hypertension Father   . Stroke Father   . Bipolar disorder Brother   . Uterine cancer Mother   . Parkinsonism Maternal Uncle     Social history:  reports that he quit smoking about 38 years ago. His smoking use included Cigarettes. He has a 24 pack-year smoking history. He has never used smokeless tobacco. He reports that he does not drink alcohol or use illicit drugs.   No Active Allergies  Medications:  Prior to Admission medications   Medication Sig Start Date End Date Taking? Authorizing Provider  ALPRAZolam Prudy Feeler) 0.5 MG tablet Take 0.5  mg by mouth at bedtime. 11/06/13  Yes Historical Provider, MD  aspirin 81 MG tablet Take 81 mg by mouth daily.   Yes Historical Provider, MD  buPROPion (BUDEPRION XL) 300 MG 24 hr tablet once daily. 06/04/14  Yes Historical Provider, MD  finasteride (PROSCAR) 5 MG tablet Take 5 mg by mouth daily. 11/06/13  Yes Historical Provider, MD  HYDROcodone-acetaminophen (NORCO/VICODIN) 5-325 MG per tablet Take 1 tablet by mouth 2 (two) times daily.   Yes Historical Provider, MD  Influenza Vac Split High-Dose (FLUZONE HIGH-DOSE) 0.5 ML SUSY Inject 1 each into the muscle once. 07/28/14  Yes Historical Provider, MD  lamoTRIgine (LAMICTAL) 100 MG tablet Take 100 mg by mouth 2 (two) times daily.  09/06/13  Yes Historical Provider, MD  losartan (COZAAR) 100 MG tablet Take 100 mg by mouth daily. 11/06/13  Yes Historical Provider, MD  lovastatin (MEVACOR) 20 MG tablet Take 20 mg by mouth once. 11/06/13  Yes Historical Provider, MD  LYRICA 150 MG capsule Take 1 capsule by mouth daily. 07/11/15  Yes Historical Provider, MD  Multiple Vitamin (MULTIVITAMIN) tablet Take 1 tablet by mouth daily.   Yes Historical Provider, MD  pregabalin (LYRICA) 50 MG capsule Take 50 mg by mouth daily.   Yes Historical Provider, MD  propranolol (INDERAL) 10 MG tablet Take 10 mg by mouth daily.  Yes Historical Provider, MD  QUEtiapine (SEROQUEL) 50 MG tablet Take 1 tablet by mouth daily. 07/17/15  Yes Historical Provider, MD  tamsulosin (FLOMAX) 0.4 MG CAPS Take 0.4 mg by mouth 2 (two) times daily.   Yes Historical Provider, MD  carbidopa-levodopa (SINEMET) 25-100 MG per tablet Take 0.5 tablets by mouth 3 (three) times daily. 07/21/15   York Spaniel, MD    ROS:  Out of a complete 14 system review of symptoms, the patient complains only of the following symptoms, and all other reviewed systems are negative.  Hearing loss Insomnia Back pain, walking difficulty Depression  Blood pressure 94/55, pulse 68, height  (1.753 m), weight  208 lb 3.2 oz (94.439 kg).  Physical Exam  General: The patient is alert and cooperative at the time of the examination.  Skin: No significant peripheral edema is noted.   Neurologic Exam  Mental status: The patient is alert and oriented x 3 at the time of the examination. The patient has apparent normal recent and remote memory, with an apparently normal attention span and concentration ability.   Cranial nerves: Facial symmetry is present. Speech is normal, no aphasia or dysarthria is noted. Extraocular movements are full, with exception of some restriction of superior gaze. Visual fields are full. Mild masking of the face is seen.  Motor: The patient has good strength in all 4 extremities.  Sensory examination: Soft touch sensation is symmetric on the face, arms, and legs.  Coordination: The patient has good finger-nose-finger and heel-to-shin bilaterally.  Gait and station: The patient is able to arise from a seated position with arms crossed. Once up, the patient has a slightly stooped posture, decreased arm swing bilaterally, more prominent on the right than the left. Tandem gait is slightly unsteady. Romberg is negative. No drift is seen.  Reflexes: Deep tendon reflexes are symmetric.   Assessment/Plan:  1. 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 The patient has asymmetry with arm swing, decreased on the right. The patient may actually have Parkinson's disease. He will be started on low dose Sinemet taking the 25/100 mg tablet, one half tablet 3 times daily. He will follow-up in 4 months, sooner if needed.  Marlan Palau MD 07/21/2015 6:44 PM  [facility] [address] [place], [place] [address]  Phone [phone] Fax [phone]

## 2015-07-21 NOTE — Patient Instructions (Signed)
We will try a low dose of Sinemet (carbidopa) 25/100 mg taking 1/2 tablet three times a day.   Fall Prevention and Home Safety Falls cause injuries and can affect all age groups. It is possible to use preventive measures to significantly decrease the likelihood of falls. There are many simple measures which can make your home safer and prevent falls. OUTDOORS  Repair cracks and edges of walkways and driveways.  Remove high doorway thresholds.  Trim shrubbery on the main path into your home.  Have good outside lighting.  Clear walkways of tools, rocks, debris, and clutter.  Check that handrails are not broken and are securely fastened. Both sides of steps should have handrails.  Have leaves, snow, and ice cleared regularly.  Use sand or salt on walkways during winter months.  In the garage, clean up grease or oil spills. BATHROOM  Install night lights.  Install grab bars by the toilet and in the tub and shower.  Use non-skid mats or decals in the tub or shower.  Place a plastic non-slip stool in the shower to sit on, if needed.  Keep floors dry and clean up all water on the floor immediately.  Remove soap buildup in the tub or shower on a regular basis.  Secure bath mats with non-slip, double-sided rug tape.  Remove throw rugs and tripping hazards from the floors. BEDROOMS  Install night lights.  Make sure a bedside light is easy to reach.  Do not use oversized bedding.  Keep a telephone by your bedside.  Have a firm chair with side arms to use for getting dressed.  Remove throw rugs and tripping hazards from the floor. KITCHEN  Keep handles on pots and pans turned toward the center of the stove. Use back burners when possible.  Clean up spills quickly and allow time for drying.  Avoid walking on wet floors.  Avoid hot utensils and knives.  Position shelves so they are not too high or low.  Place commonly used objects within easy reach.  If  necessary, use a sturdy step stool with a grab bar when reaching.  Keep electrical cables out of the way.  Do not use floor polish or wax that makes floors slippery. If you must use wax, use non-skid floor wax.  Remove throw rugs and tripping hazards from the floor. STAIRWAYS  Never leave objects on stairs.  Place handrails on both sides of stairways and use them. Fix any loose handrails. Make sure handrails on both sides of the stairways are as long as the stairs.  Check carpeting to make sure it is firmly attached along stairs. Make repairs to worn or loose carpet promptly.  Avoid placing throw rugs at the top or bottom of stairways, or properly secure the rug with carpet tape to prevent slippage. Get rid of throw rugs, if possible.  Have an electrician put in a light switch at the top and bottom of the stairs. OTHER FALL PREVENTION TIPS  Wear low-heel or rubber-soled shoes that are supportive and fit well. Wear closed toe shoes.  When using a stepladder, make sure it is fully opened and both spreaders are firmly locked. Do not climb a closed stepladder.  Add color or contrast paint or tape to grab bars and handrails in your home. Place contrasting color strips on first and last steps.  Learn and use mobility aids as needed. Install an electrical emergency response system.  Turn on lights to avoid dark areas. Replace light bulbs that  burn out immediately. Get light switches that glow.  Arrange furniture to create clear pathways. Keep furniture in the same place.  Firmly attach carpet with non-skid or double-sided tape.  Eliminate uneven floor surfaces.  Select a carpet pattern that does not visually hide the edge of steps.  Be aware of all pets. OTHER HOME SAFETY TIPS  Set the water temperature for 120 F (48.8 C).  Keep emergency numbers on or near the telephone.  Keep smoke detectors on every level of the home and near sleeping areas. Document Released: 10/04/2002  Document Revised: 04/14/2012 Document Reviewed: 01/03/2012 [facility] Patient Information 2015 [place], [place]. This information is not intended to replace advice given to you by your health care provider. Make sure you discuss any questions you have with your health care provider.

## 2015-08-19 ENCOUNTER — Other Ambulatory Visit: Payer: Self-pay | Admitting: Family Medicine

## 2015-08-19 DIAGNOSIS — M79606 Pain in leg, unspecified: Secondary | ICD-10-CM

## 2015-08-27 ENCOUNTER — Ambulatory Visit
Admission: RE | Admit: 2015-08-27 | Discharge: 2015-08-27 | Disposition: A | Discharge status: Home / Self Care | Payer: Medicare Other | Source: Ambulatory Visit | Attending: Family Medicine | Admitting: Family Medicine

## 2015-08-27 DIAGNOSIS — M79606 Pain in leg, unspecified: Secondary | ICD-10-CM

## 2015-09-14 ENCOUNTER — Telehealth: Payer: Self-pay | Admitting: Neurology

## 2015-09-14 MED ORDER — CARBIDOPA-LEVODOPA 25-100 MG PO TABS: 1.0000 | ORAL_TABLET | Freq: Three times a day (TID) | ORAL | Status: DC

## 2015-09-14 NOTE — Telephone Encounter (Signed)
I called patient. He has gained some benefit with walking on the low-dose Sinemet, the balance is still not the best, I would increase his Sinemet taking the 25/100 mg tablet 3 times daily taking one full tablet.

## 2015-09-14 NOTE — Telephone Encounter (Signed)
Patient called regarding carbidopa-levodopa (SINEMET) 25-100 MG per tablet, states Dr. Anne HahnWillis gave him a small dose 1/2 tab three times per day to see how he would do on it. States the medication helps with walking but not much with balance. Patient wants to know how to proceed with taking this medicine.

## 2015-10-10 ENCOUNTER — Telehealth: Payer: Self-pay | Admitting: Neurology

## 2015-10-10 NOTE — Telephone Encounter (Signed)
Patient called to advise he's not doing quite as well as he was, having more trouble walking than he was.

## 2015-10-10 NOTE — Telephone Encounter (Signed)
I called the patient. He is having increased difficulty with walking and balancing. He states he has some good days and some bad days, but most are bad. He cannot tell that the increase in Sinemet has helped at all. It was actually time for another appointment for the patient anyway. Appointment scheduled for tomorrow 12/14.

## 2015-10-11 ENCOUNTER — Ambulatory Visit (INDEPENDENT_AMBULATORY_CARE_PROVIDER_SITE_OTHER): Payer: Medicare Other | Admitting: Neurology

## 2015-10-11 ENCOUNTER — Encounter: Payer: Self-pay | Admitting: Neurology

## 2015-10-11 VITALS — BP 111/64 | HR 81 | Ht 69.0 in | Wt 198.5 lb

## 2015-10-11 DIAGNOSIS — R269 Unspecified abnormalities of gait and mobility: Secondary | ICD-10-CM | POA: Diagnosis not present

## 2015-10-11 NOTE — Progress Notes (Signed)
Reason for visit: Gait disorder  Clayton Burke is an 74 y.o. male  History of present illness:  Clayton Burke is a 74 year old right-handed white male with a history of headache chronic gait disorder. 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 He does have features of Parkinson's disease with shuffling with turns, he has gait instability, decreased arm swing. He has had evidence of ventriculomegaly on MRI and CT of the brain. This has remained stable over time. The patient had a DAT scan done, this was unremarkable, without evidence of Parkinson's disease. The patient eventually was taken off of the Abilify, and switched to Seroquel. He believes that his walking has actually worsened off of Abilify. For this reason, he was placed on a trial on Sinemet, working up to 25/100 mg 3 times daily. He believes that on the Sinemet his walking has continued to progress. He will have significant variations from day-to-day in his ability to walk. He may have several bad days, and then seem to do fairly well with the walking for several days. He denies issues controlling the bowels or the bladder. He does have some mild memory issues, particularly for names. He comes back to the office today for an evaluation.  Past Medical History  Diagnosis Date  . HTN (hypertension)   . Migraine   . ADD (attention deficit disorder)   . Gout   . Bipolar 1 disorder (HCC)   . DDD (degenerative disc disease), cervical   . Diverticulosis   . BPH (benign prostatic hyperplasia)   . Hyperlipidemia   . PVC (premature ventricular contraction)   . LBBB (left bundle branch block)   . Radiculopathy     lumbar with right leg pain  . Zoster   . Depression   . 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 Abnormality of gait 05/12/2013  . Hydrocephalus, communicating     Past Surgical History  Procedure Laterality Date  . Tonsillectomy and adenoidectomy    . Cataract extraction    . Belpharoptosis repair       Family History  Problem Relation Age of Onset  . Hypertension Father   . Stroke Father   . Bipolar disorder Brother   . Uterine cancer Mother   . Parkinsonism Maternal Uncle     Social history:  reports that he quit smoking about 38 years ago. His smoking use included Cigarettes. He has a 24 pack-year smoking history. He has never used smokeless tobacco. He reports that he drinks about 8.4 oz of alcohol per week. He reports that he does not use illicit drugs.   No Known Allergies  Medications:  Prior to Admission medications   Medication Sig Start Date End Date Taking? Authorizing Provider  ALPRAZolam Prudy Feeler(XANAX) 0.5 MG tablet Take 0.5 mg by mouth at bedtime. 11/06/13  Yes Historical Provider, MD  aspirin 81 MG tablet Take 81 mg by mouth daily.   Yes Historical Provider, MD  buPROPion (BUDEPRION XL) 300 MG 24 hr tablet once daily. 06/04/14  Yes Historical Provider, MD  carbidopa-levodopa (SINEMET) 25-100 MG tablet Take 1 tablet by mouth 3 (three) times daily. 09/14/15  Yes York Spanielharles K Oleta Gunnoe, MD  finasteride (PROSCAR) 5 MG tablet Take 5 mg by mouth daily. 11/06/13  Yes Historical Provider, MD  HYDROcodone-acetaminophen (NORCO/VICODIN) 5-325 MG per tablet Take 1 tablet by mouth 2 (two) times daily.   Yes Historical Provider, MD  Influenza Vac Split High-Dose (FLUZONE HIGH-DOSE) 0.5 ML SUSY Inject 1 each into the muscle  once. 07/28/14  Yes Historical Provider, MD  lamoTRIgine (LAMICTAL) 100 MG tablet Take 100 mg by mouth 2 (two) times daily.  09/06/13  Yes Historical Provider, MD  losartan (COZAAR) 100 MG tablet Take 100 mg by mouth daily. 11/06/13  Yes Historical Provider, MD  lovastatin (MEVACOR) 20 MG tablet Take 20 mg by mouth once. 11/06/13  Yes Historical Provider, MD  Multiple Vitamin (MULTIVITAMIN) tablet Take 1 tablet by mouth daily.   Yes Historical Provider, MD  pregabalin (LYRICA) 50 MG capsule Take 50 mg by mouth daily.   Yes Historical Provider, MD  propranolol (INDERAL) 10 MG tablet  Take 10 mg by mouth daily.    Yes Historical Provider, MD  QUEtiapine (SEROQUEL) 50 MG tablet Take 1 tablet by mouth daily. 07/17/15  Yes Historical Provider, MD  tamsulosin (FLOMAX) 0.4 MG CAPS Take 0.4 mg by mouth 2 (two) times daily.   Yes Historical Provider, MD    ROS:  Out of a complete 14 system review of symptoms, the patient complains only of the following symptoms, and all other reviewed systems are negative.  Hearing loss Eye redness Walking difficulty Weakness, balance issues Depression  Blood pressure 111/64, pulse 81, height  (1.753 m), weight 198 lb 8 oz (90.039 kg).  Physical Exam  General: The patient is alert and cooperative at the time of the examination.  Skin: No significant peripheral edema is noted.   Neurologic Exam  Mental status: The patient is alert and oriented x 3 at the time of the examination. The patient has apparent normal recent and remote memory, with an apparently normal attention span and concentration ability.   Cranial nerves: Facial symmetry is present. Speech is normal, no aphasia or dysarthria is noted. Extraocular movements are full. Visual fields are full. Mild masking the face is seen.  Motor: The patient has good strength in all 4 extremities.  Sensory examination: Soft touch sensation is symmetric on the face, arms, and legs.  Coordination: The patient has good finger-nose-finger and heel-to-shin bilaterally.  Gait and station: The patient is able to arise from a seated position with arms crossed. Once up, he is able to walk independently, but he does have decreased arm swing, left greater than right. He has some shuffling with turns, slightly stooped posture is noted. Tandem gait is minimally unsteady. Romberg is negative. No drift is seen. A timed walk test with 6 laps of 25 feet was done in 40 seconds.  Reflexes: Deep tendon reflexes are symmetric.   Assessment/Plan:  1. Gait disorder  2. Parkinsonism  3.  Ventriculomegaly by MRI   The patient will need to be evaluated for possible normal pressure hydrocephalus. He has had a normal DAT scan in the past, and he did not respond to medical therapy with Sinemet. The patient has undergone some physical therapy for his walking. He is staying active, going to the [facility] for exercise. He has fallen recently, within the last week or 2. He does not use a cane for ambulation. He will be set up for CT scan of the brain to compare to the prior study done in 2014. We may consider a lumbar puncture and reevaluation of his gait following this. Otherwise, he will follow-up in 3-4 months.  Marlan Palau MD 10/11/2015 7:04 PM  [facility] [address] [place], [place] [address]  Phone [phone] Fax [phone]

## 2015-10-11 NOTE — Patient Instructions (Signed)
Fall Prevention in the Home  Falls can cause injuries and can affect people from all age groups. There are many simple things that you can do to make your home safe and to help prevent falls. WHAT CAN I DO ON THE OUTSIDE OF MY HOME?  Regularly repair the edges of walkways and driveways and fix any cracks.  Remove high doorway thresholds.  Trim any shrubbery on the main path into your home.  Use bright outdoor lighting.  Clear walkways of debris and clutter, including tools and rocks.  Regularly check that handrails are securely fastened and in good repair. Both sides of any steps should have handrails.  Install guardrails along the edges of any raised decks or porches.  Have leaves, snow, and ice cleared regularly.  Use sand or salt on walkways during winter months.  In the garage, clean up any spills right away, including grease or oil spills. WHAT CAN I DO IN THE BATHROOM?  Use night lights.  Install grab bars by the toilet and in the tub and shower. Do not use towel bars as grab bars.  Use non-skid mats or decals on the floor of the tub or shower.  If you need to sit down while you are in the shower, use a plastic, non-slip stool..  Keep the floor dry. Immediately clean up any water that spills on the floor.  Remove soap buildup in the tub or shower on a regular basis.  Attach bath mats securely with double-sided non-slip rug tape.  Remove throw rugs and other tripping hazards from the floor. WHAT CAN I DO IN THE BEDROOM?  Use night lights.  Make sure that a bedside light is easy to reach.  Do not use oversized bedding that drapes onto the floor.  Have a firm chair that has side arms to use for getting dressed.  Remove throw rugs and other tripping hazards from the floor. WHAT CAN I DO IN THE KITCHEN?   Clean up any spills right away.  Avoid walking on wet floors.  Place frequently used items in easy-to-reach places.  If you need to reach for something  above you, use a sturdy step stool that has a grab bar.  Keep electrical cables out of the way.  Do not use floor polish or wax that makes floors slippery. If you have to use wax, make sure that it is non-skid floor wax.  Remove throw rugs and other tripping hazards from the floor. WHAT CAN I DO IN THE STAIRWAYS?  Do not leave any items on the stairs.  Make sure that there are handrails on both sides of the stairs. Fix handrails that are broken or loose. Make sure that handrails are as long as the stairways.  Check any carpeting to make sure that it is firmly attached to the stairs. Fix any carpet that is loose or worn.  Avoid having throw rugs at the top or bottom of stairways, or secure the rugs with carpet tape to prevent them from moving.  Make sure that you have a light switch at the top of the stairs and the bottom of the stairs. If you do not have them, have them installed. WHAT ARE SOME OTHER FALL PREVENTION TIPS?  Wear closed-toe shoes that fit well and support your feet. Wear shoes that have rubber soles or low heels.  When you use a stepladder, make sure that it is completely opened and that the sides are firmly locked. Have someone hold the ladder while you   are using it. Do not climb a closed stepladder.  Add color or contrast paint or tape to grab bars and handrails in your home. Place contrasting color strips on the first and last steps.  Use mobility aids as needed, such as canes, walkers, scooters, and crutches.  Turn on lights if it is dark. Replace any light bulbs that burn out.  Set up furniture so that there are clear paths. Keep the furniture in the same spot.  Fix any uneven floor surfaces.  Choose a carpet design that does not hide the edge of steps of a stairway.  Be aware of any and all pets.  Review your medicines with your healthcare provider. Some medicines can cause dizziness or changes in blood pressure, which increase your risk of falling. Talk  with your health care provider about other ways that you can decrease your risk of falls. This may include working with a physical therapist or trainer to improve your strength, balance, and endurance.   This information is not intended to replace advice given to you by your health care provider. Make sure you discuss any questions you have with your health care provider.   Document Released: 10/04/2002 Document Revised: 02/28/2015 Document Reviewed: 11/18/2014 Elsevier Interactive Patient Education 2016 [employer].  

## 2015-10-19 ENCOUNTER — Ambulatory Visit
Admission: RE | Admit: 2015-10-19 | Discharge: 2015-10-19 | Disposition: A | Discharge status: Home / Self Care | Payer: Medicare Other | Source: Ambulatory Visit | Attending: Neurology | Admitting: Neurology

## 2015-10-19 DIAGNOSIS — R269 Unspecified abnormalities of gait and mobility: Secondary | ICD-10-CM

## 2015-10-24 ENCOUNTER — Telehealth: Payer: Self-pay | Admitting: Neurology

## 2015-10-24 DIAGNOSIS — R269 Unspecified abnormalities of gait and mobility: Secondary | ICD-10-CM

## 2015-10-24 NOTE — Telephone Encounter (Signed)
Spoke to pt regarding his CT head results. I advised him that Dr. Anne HahnWillis is out of the office but Dr. Vickey Hugerohmeier took a look at his CT results and said to tell pt that there is a slight increase in cerebral atrophy but no strokes, tumors, or bleeds noted. Pt verbalized understanding. Pt is asking that when Dr. Anne HahnWillis gets back in the office for him to please look at the CT head and look for the "extra fluid" that he mentioned he was looking for in the office visit. Pt knows that Dr. Anne HahnWillis is out of the office at this time.  Pt verbalized understanding and appreciation.

## 2015-10-24 NOTE — Telephone Encounter (Signed)
Patient is calling and would like to have the results of his CT scan on 10/19/15.  Please call.  Thanks!

## 2015-10-24 NOTE — Telephone Encounter (Signed)
Slight increase of cerebral atrophy noted, no stroke, tumor or bleed. CD

## 2015-10-25 NOTE — Telephone Encounter (Signed)
I called patient. CT the head does show some atrophy, the ventriculomegaly has increased slightly from prior CT scan evaluation. I believe that the vertex of the head shows crowding of the sulci that is consistent with normal pressure hydrocephalus. I have recommended the patient consider lumbar puncture, we will get this done. I will see the patient the day after this procedure to reevaluate the walking.  CT head 10/20/2015:  IMPRESSION:  Abnormal CT head (without) demonstrating: 1. Moderate frontal and perisylvian atrophy.  2. Moderate-severe ventriculomegaly likely on ex vacuo basis. 3. No acute findings. 4. Compared to MRI on 04/14/13, atrophy and ventriculomegaly have slightly increased.

## 2015-10-31 ENCOUNTER — Other Ambulatory Visit: Payer: Self-pay | Admitting: Neurology

## 2015-10-31 NOTE — Telephone Encounter (Signed)
I called the patient. I explained that the 1:15 appointment on Thursday will not work because the patient will have to be on bedrest for 24 hours and we close at 12 on Fridays. He is going to call Rayfield CitizenCaroline back and reschedule. He would like to know what he can take for anxiety before the procedure. He currently takes Xanax 0.5 mg 2 tablets at bedtime. He would like to know how much of his Xanax he should take prior to procedure.

## 2015-10-31 NOTE — Telephone Encounter (Signed)
I called and spoke to Schalleraroline at [facility]. I schedule patient's LP for 11/02/15 at 7:15 AM, which was the first available appointment and the only one this week that would allow the patient to come back the following day for an evaluation with Dr. Anne HahnWillis. I called the patient to let him know. He did not want to come that early in the morning and wished to reschedule appointment for LP. I gave him Caroline's number [phone]) and advised he call to schedule the time that works best for him. I also advised he call me back afterwards so we can schedule him coming into our office the following afternoon. I gave him all of the instructions for the LP, per Rayfield CitizenCaroline (bring a driver, no eating for 4 hours prior to procedure, and prepare to be on bedrest for 24 hours after the procedure).

## 2015-10-31 NOTE — Telephone Encounter (Signed)
[facility] following spinal tap 11/02/15 1:15pm. Also wants to know what he can take for anxiety prior to spinal tap? Please call [phone].

## 2015-10-31 NOTE — Telephone Encounter (Signed)
I called patient. He can take up to 1.5 mg of the alprazolam. I do not want him too sedated, this may impair his balance afterwards which is what we are trying to evaluate.

## 2015-10-31 NOTE — Telephone Encounter (Signed)
I called the patient. He wanted to know how much Xanax he can take prior to LP. Per Dr. Anne HahnWillis, he can take 1.5 mg. Patient verbalized understanding. LP is scheduled for tomorrow morning.

## 2015-10-31 NOTE — Telephone Encounter (Signed)
[facility]

## 2015-11-01 ENCOUNTER — Other Ambulatory Visit: Payer: Self-pay | Admitting: Neurology

## 2015-11-01 ENCOUNTER — Ambulatory Visit
Admission: RE | Admit: 2015-11-01 | Discharge: 2015-11-01 | Disposition: A | Discharge status: Home / Self Care | Payer: Medicare Other | Source: Ambulatory Visit | Attending: Neurology | Admitting: Neurology

## 2015-11-01 DIAGNOSIS — R269 Unspecified abnormalities of gait and mobility: Secondary | ICD-10-CM

## 2015-11-01 LAB — CSF CELL COUNT WITH DIFFERENTIAL
RBC Count, CSF: 0 cu mm
Tube #: 3
WBC CSF: 0 uL (ref 0–5)

## 2015-11-01 LAB — GLUCOSE, CSF: Glucose, CSF: 58 mg/dL (ref 43–76)

## 2015-11-01 LAB — PROTEIN, CSF: TOTAL PROTEIN, CSF: 26 mg/dL (ref 15–45)

## 2015-11-01 NOTE — Discharge Instructions (Signed)

## 2015-11-02 ENCOUNTER — Ambulatory Visit (INDEPENDENT_AMBULATORY_CARE_PROVIDER_SITE_OTHER): Payer: Medicare Other | Admitting: Neurology

## 2015-11-02 ENCOUNTER — Encounter: Payer: Self-pay | Admitting: Neurology

## 2015-11-02 ENCOUNTER — Other Ambulatory Visit: Payer: Medicare Other

## 2015-11-02 VITALS — BP 100/58 | HR 70 | Ht 69.0 in | Wt 194.5 lb

## 2015-11-02 DIAGNOSIS — R269 Unspecified abnormalities of gait and mobility: Secondary | ICD-10-CM

## 2015-11-02 NOTE — Progress Notes (Signed)
Reason for visit: Gait disturbance  Clayton Burke is an 75 y.o. male  History of present illness:  Clayton Burke is a 75 year old right-handed white male with a history of a gait disturbance. The patient is returning today following a lumbar puncture yesterday. Opening pressure was 200 mm of water. The patient has had a mild enlargement of the ventricular space on CT, there is some question whether this is related to atrophy. The patient has had a DAT scan that was negative, he did not respond to Sinemet. The patient believes that this morning his walking was much better. His wife indicates that he may have variations in his ability to ambulate anyway even prior to the lumbar puncture. The patient has had only a couple small stumbles since last seen in office. He comes in today for reevaluation.  Past Medical History  Diagnosis Date  . HTN (hypertension)   . Migraine   . ADD (attention deficit disorder)   . Gout   . Bipolar 1 disorder (HCC)   . DDD (degenerative disc disease), cervical   . Diverticulosis   . BPH (benign prostatic hyperplasia)   . Hyperlipidemia   . PVC (premature ventricular contraction)   . LBBB (left bundle branch block)   . Radiculopathy     lumbar with right leg pain  . Zoster   . Depression   . 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 Abnormality of gait 05/12/2013  . Hydrocephalus, communicating     Past Surgical History  Procedure Laterality Date  . Tonsillectomy and adenoidectomy    . Cataract extraction    . Belpharoptosis repair      Family History  Problem Relation Age of Onset  . Hypertension Father   . Stroke Father   . Bipolar disorder Brother   . Uterine cancer Mother   . Parkinsonism Maternal Uncle     Social history:  reports that he quit smoking about 38 years ago. His smoking use included Cigarettes. He has a 24 pack-year smoking history. He has never used smokeless tobacco. He reports that he drinks about 8.4 oz of alcohol per week.  He reports that he does not use illicit drugs.   No Known Allergies  Medications:  Prior to Admission medications   Medication Sig Start Date End Date Taking? Authorizing Provider  ALPRAZolam Prudy Feeler(XANAX) 0.5 MG tablet Take 0.5 mg by mouth at bedtime. 11/06/13  Yes Historical Provider, MD  aspirin 81 MG tablet Take 81 mg by mouth daily.   Yes Historical Provider, MD  buPROPion (BUDEPRION XL) 300 MG 24 hr tablet once daily. 06/04/14  Yes Historical Provider, MD  finasteride (PROSCAR) 5 MG tablet Take 5 mg by mouth daily. 11/06/13  Yes Historical Provider, MD  HYDROcodone-acetaminophen (NORCO/VICODIN) 5-325 MG per tablet Take 1 tablet by mouth 2 (two) times daily.   Yes Historical Provider, MD  Influenza Vac Split High-Dose (FLUZONE HIGH-DOSE) 0.5 ML SUSY Inject 1 each into the muscle once. 07/28/14  Yes Historical Provider, MD  lamoTRIgine (LAMICTAL) 100 MG tablet Take 100 mg by mouth 2 (two) times daily.  09/06/13  Yes Historical Provider, MD  losartan (COZAAR) 100 MG tablet Take 100 mg by mouth daily. 11/06/13  Yes Historical Provider, MD  lovastatin (MEVACOR) 20 MG tablet Take 20 mg by mouth once. 11/06/13  Yes Historical Provider, MD  Multiple Vitamin (MULTIVITAMIN) tablet Take 1 tablet by mouth daily.   Yes Historical Provider, MD  pregabalin (LYRICA) 50 MG capsule Take 50 mg by  mouth daily.   Yes Historical Provider, MD  propranolol (INDERAL) 10 MG tablet Take 10 mg by mouth daily.    Yes Historical Provider, MD  QUEtiapine (SEROQUEL) 50 MG tablet Take 1 tablet by mouth daily. 07/17/15  Yes Historical Provider, MD  tamsulosin (FLOMAX) 0.4 MG CAPS Take 0.4 mg by mouth 2 (two) times daily.   Yes Historical Provider, MD    ROS:  Out of a complete 14 system review of symptoms, the patient complains only of the following symptoms, and all other reviewed systems are negative.  Hearing loss Walking difficulty Depression  Blood pressure 100/58, pulse 70, height 5\' 9"  (1.753 m), weight 194 lb 8 oz  (88.225 kg).  Physical Exam  General: The patient is alert and cooperative at the time of the examination.  Skin: No significant peripheral edema is noted.   Neurologic Exam  Mental status: The patient is alert and oriented x 3 at the time of the examination. The patient has apparent normal recent and remote memory, with an apparently normal attention span and concentration ability.   Cranial nerves: Facial symmetry is present. Speech is normal, no aphasia or dysarthria is noted. Extraocular movements are full. Visual fields are full.  Gait and station: The patient has a normal gait. Tandem gait is slightly unsteady. Romberg is negative. No drift is seen. With a timed walk test, 25 feet with 6 laps, the patient was able to walk this and 32 seconds. He did have some unsteadiness with turns.  Reflexes: Deep tendon reflexes are symmetric.   Assessment/Plan:  1. Gait disturbance  2. Ventriculomegaly by CT  It is not clear that the patient has normal pressure hydrocephalus. We will watch the patient over the next several months, if the walking continues to deteriorate, we may consider a neurosurgical referral. The patient did improve in the walk time today, going from 40 to 32 seconds, there is some indication that his ability to ambulate this morning was much better. He will follow-up in 4 months.  Marlan Palau MD 11/02/2015 6:20 PM  [facility] [address] [place], [place] [address]  Phone [phone] Fax [phone]

## 2015-11-03 LAB — VDRL, CSF: SYPHILIS VDRL QUANT CSF: NONREACTIVE

## 2015-11-07 ENCOUNTER — Telehealth: Payer: Self-pay | Admitting: Neurology

## 2015-11-07 NOTE — Telephone Encounter (Signed)
I called the patient and left voicemail asking him to call me back tomorrow.

## 2015-11-07 NOTE — Telephone Encounter (Signed)
Pt called requesting to speak with Dr Anne HahnWillis reg a second opinion. Pt chose not to go into detail.

## 2015-11-08 ENCOUNTER — Telehealth: Payer: Self-pay | Admitting: Neurology

## 2015-11-08 NOTE — Telephone Encounter (Signed)
I called patient. The patient indicates that he felt like he could walk much better for about 5 or 6 days after the spinal tap. He is now back to his usual baseline. We will continue to follow the gait, if he continues to deteriorate, we will get him to a neurosurgeon.

## 2015-11-08 NOTE — Telephone Encounter (Signed)
I called the patient. He stated that he wanted to let Dr. Anne HahnWillis know that last week after the LP his walking was better, but now it seems to be back to the way it was before the LP. He feels very flat-footed and like he cannot walk "heel-toe." He also c/o being very slow. I advised that I would send Dr. Anne HahnWillis the message.

## 2015-11-08 NOTE — Telephone Encounter (Signed)
Patient is calling to discuss his condition. The patient did not give any details. Thank you.

## 2015-11-08 NOTE — Telephone Encounter (Signed)
I called the patient. [name] He would like to be referred there for a second opinion. He stated he would call back with the specific doctor's name and information of where to fax the referral.

## 2015-11-13 ENCOUNTER — Telehealth: Payer: Self-pay | Admitting: Neurology

## 2015-11-13 NOTE — Telephone Encounter (Signed)
Patient called to inquire if [facility] received the information he left this morning and if everything was in order.

## 2015-11-13 NOTE — Telephone Encounter (Signed)
Patient is calling and states that he is being by a release of information form for Dr. Anne HahnWillis to sign so that he can get a second opinion.  He will leave up front.  Thanks!

## 2015-11-14 NOTE — Telephone Encounter (Signed)
Rn call patient back about the release of information to be fax to [facility] at [phone]. Pt stated he wants everything fax such as all scans, labs, tests, office notes sent to [facility]. Pt was last seen by Dr. Anne Hahn 11/02/2015. He also stated Dr.Willis was suppose to write up his findings for the second opinion. Rn stated the form has been receive and will be given to medical records.

## 2015-11-15 ENCOUNTER — Telehealth: Payer: Self-pay | Admitting: *Deleted

## 2015-11-15 NOTE — Telephone Encounter (Signed)
Medical records faxed to [facility] on 11/15/15 to attn Cleophas Dunker.

## 2015-12-22 ENCOUNTER — Other Ambulatory Visit: Payer: Self-pay | Admitting: *Deleted

## 2015-12-22 DIAGNOSIS — R2681 Unsteadiness on feet: Secondary | ICD-10-CM

## 2015-12-22 DIAGNOSIS — M545 Low back pain, unspecified: Secondary | ICD-10-CM

## 2015-12-22 DIAGNOSIS — G8929 Other chronic pain: Secondary | ICD-10-CM

## 2015-12-22 DIAGNOSIS — G9389 Other specified disorders of brain: Secondary | ICD-10-CM

## 2016-01-02 ENCOUNTER — Other Ambulatory Visit: Payer: Medicare Other

## 2016-01-04 ENCOUNTER — Other Ambulatory Visit: Payer: Self-pay | Admitting: Psychiatry

## 2016-01-24 ENCOUNTER — Telehealth: Payer: Self-pay | Admitting: Neurology

## 2016-01-24 NOTE — Telephone Encounter (Signed)
I called patient. The [facility] for possible normal pressure hydrocephalus. He had a repeat spinal tap, he appeared to improve following the spinal tap. They did not think he was a candidate quite yet, they wanted to follow him over time, if he had deterioration, they may consider a VP shunt. They will have the information sent to me.

## 2016-01-24 NOTE — Telephone Encounter (Signed)
Patient is calling. He wants to discuss results from a visit he had to [facility].

## 2016-01-25 ENCOUNTER — Telehealth: Payer: Self-pay | Admitting: Neurology

## 2016-01-25 NOTE — Telephone Encounter (Signed)
I have received the medical records from [facility]. The patient has an evaluation for possible normal pressure hydrocephalus. He underwent another lumbar puncture, he was found to have some improvement in the 10 m walk, and cognitive testing, and 6 minute walk was no change. The degree of change however was not at their threshold of 30% improvement. MRI the brain was done showing ventriculomegaly out of portion to cortical atrophy, and MRI of the cervical spine and lumbar spines were done showing degenerative changes most significant at the C3-4 level, minimal flattening of the cord was noted without cord signal. A sacral fracture at the S3 level was noted. If the patient continues to get worse, reevaluation may be done.

## 2016-01-27 ENCOUNTER — Other Ambulatory Visit (HOSPITAL_COMMUNITY): Payer: Self-pay | Admitting: Psychiatry

## 2016-01-30 NOTE — Telephone Encounter (Signed)
I spoke to pt and he will bring in when in for appt 02-05-16.  If needs sooner, we are to call him back.

## 2016-01-30 NOTE — Telephone Encounter (Signed)
Pt called inquiring if Dr Anne HahnWillis needs the  MRI disk from [facility]. Said he should be getting actual disk sometime this week.

## 2016-02-05 ENCOUNTER — Ambulatory Visit (INDEPENDENT_AMBULATORY_CARE_PROVIDER_SITE_OTHER): Payer: Medicare Other | Admitting: Neurology

## 2016-02-05 ENCOUNTER — Encounter: Payer: Self-pay | Admitting: Neurology

## 2016-02-05 VITALS — BP 108/62 | HR 70 | Ht 69.0 in | Wt 193.0 lb

## 2016-02-05 DIAGNOSIS — R269 Unspecified abnormalities of gait and mobility: Secondary | ICD-10-CM

## 2016-02-05 DIAGNOSIS — G9389 Other specified disorders of brain: Secondary | ICD-10-CM

## 2016-02-05 HISTORY — DX: Other specified disorders of brain: G93.89

## 2016-02-05 NOTE — Progress Notes (Signed)
Reason for visit: Gait disorder  Clayton Burke is an 75 y.o. male  History of present illness:  Clayton Burke is a 75 year old right-handed white male with a history of a gait disorder. The patient has been noted to have ventriculomegaly by MRI. He has gained modest benefits following lumbar puncture. He went to [facility], a repeat lumbar puncture was done, again some benefit was noted but no significant benefit was seen. VP shunt placement was not recommended at that time, but he will follow-up in 6 months. A repeat MRI of the brain was done at [facility], the disc was brought for my review. This continues to show ventriculomegaly with minimal cortical atrophy. The patient does not have significant small vessel disease. He denies any problems with bladder control, he denies any memory issues. He indicates that over the last 6 months his walking has been relatively stable, he will fall on occasion. He has good days and bad days with walking. He does not use a cane or a walker for ambulation.  Past Medical History  Diagnosis Date  . HTN (hypertension)   . Migraine   . ADD (attention deficit disorder)   . Gout   . Bipolar 1 disorder (HCC)   . DDD (degenerative disc disease), cervical   . Diverticulosis   . BPH (benign prostatic hyperplasia)   . Hyperlipidemia   . PVC (premature ventricular contraction)   . LBBB (left bundle branch block)   . Radiculopathy     lumbar with right leg pain  . Zoster   . Depression   . 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 Abnormality of gait 05/12/2013  . Hydrocephalus, communicating   . 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 Tonsillectomy and adenoidectomy    . Cataract extraction    . Belpharoptosis repair      Family History  Problem Relation Age of Onset  . Hypertension Father   . Stroke Father   . Bipolar disorder Brother   . Uterine cancer Mother   . Parkinsonism Maternal Uncle      Social history:  reports that he quit smoking about 38 years ago. His smoking use included Cigarettes. He has a 24 pack-year smoking history. He has never used smokeless tobacco. He reports that he drinks about 8.4 oz of alcohol per week. He reports that he does not use illicit drugs.   No Known Allergies  Medications:  Prior to Admission medications   Medication Sig Start Date End Date Taking? Authorizing Provider  ALPRAZolam Prudy Feeler) 0.5 MG tablet Take 0.5 mg by mouth at bedtime. 11/06/13  Yes Historical Provider, MD  aspirin 81 MG tablet Take 81 mg by mouth daily.   Yes Historical Provider, MD  buPROPion (BUDEPRION XL) 300 MG 24 hr tablet once daily. 06/04/14  Yes Historical Provider, MD  finasteride (PROSCAR) 5 MG tablet Take 5 mg by mouth daily. 11/06/13  Yes Historical Provider, MD  HYDROcodone-acetaminophen (NORCO/VICODIN) 5-325 MG per tablet Take 1 tablet by mouth 2 (two) times daily.   Yes Historical Provider, MD  Influenza Vac Split High-Dose (FLUZONE HIGH-DOSE) 0.5 ML SUSY Inject 1 each into the muscle once. 07/28/14  Yes Historical Provider, MD  lamoTRIgine (LAMICTAL) 100 MG tablet Take 100 mg by mouth 2 (two) times daily.  09/06/13  Yes Historical Provider, MD  losartan (COZAAR) 100 MG tablet Take 100 mg by mouth daily. 11/06/13  Yes Historical Provider, MD  lovastatin (MEVACOR) 20  MG tablet Take 20 mg by mouth once. 11/06/13  Yes Historical Provider, MD  Multiple Vitamin (MULTIVITAMIN) tablet Take 1 tablet by mouth daily.   Yes Historical Provider, MD  pregabalin (LYRICA) 50 MG capsule Take 50 mg by mouth daily.   Yes Historical Provider, MD  propranolol (INDERAL) 10 MG tablet Take 10 mg by mouth daily.    Yes Historical Provider, MD  QUEtiapine (SEROQUEL) 50 MG tablet Take 25 mg by mouth daily.  07/17/15  Yes Historical Provider, MD  tamsulosin (FLOMAX) 0.4 MG CAPS Take 0.4 mg by mouth 2 (two) times daily.   Yes Historical Provider, MD    ROS:  Out of a complete 14 system review  of symptoms, the patient complains only of the following symptoms, and all other reviewed systems are negative.  Hearing loss Back pain, walking difficulty Weakness Depression  Blood pressure 108/62, pulse 70, height 5\' 9"  (1.753 m), weight 193 lb (87.544 kg).  Physical Exam  General: The patient is alert and cooperative at the time of the examination.  Skin: No significant peripheral edema is noted.   Neurologic Exam  Mental status: The patient is alert and oriented x 3 at the time of the examination. The patient has apparent normal recent and remote memory, with an apparently normal attention span and concentration ability.   Cranial nerves: Facial symmetry is present. Speech is normal, no aphasia or dysarthria is noted. Extraocular movements are full. Visual fields are full.  Motor: The patient has good strength in all 4 extremities.  Sensory examination: Soft touch sensation is symmetric on the face, arms, and legs.  Coordination: The patient has good finger-nose-finger and heel-to-shin bilaterally.  Gait and station: The patient has a slightly wide-based gait. The patient does not walk with a cane or walker. Tandem gait is unsteady. Romberg is negative. No drift is seen.  Reflexes: Deep tendon reflexes are symmetric.   Assessment/Plan:  1. Ventriculomegaly  2. 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 [facility] be [facility]. We will follow-up in about 6 months. If his walking deteriorates, he will contact our office.  Marlan Palau. Keith Willis MD 02/05/2016 8:09 PM  [facility] [address] [place], [place] [id number]  Phone [phone] Fax [phone]

## 2016-02-22 ENCOUNTER — Other Ambulatory Visit (HOSPITAL_COMMUNITY): Payer: Self-pay | Admitting: Psychiatry

## 2016-02-27 ENCOUNTER — Telehealth: Payer: Self-pay | Admitting: Cardiology

## 2016-02-27 NOTE — Telephone Encounter (Signed)
OK to see any doctor he wants.  Thanks.

## 2016-02-27 NOTE — Telephone Encounter (Signed)
Lauren is calling   She verbalized Dr.Blomgren has referral for pt to see Dr.Crenshaw and or Dr.Smith per pt request  Gave her first available date to inform pt of possible wait time  Dr.Crenshaw July 7th  or Dr.Smith Aug 31st  Informed her on the way the office has to ask all providers listed for change   Pt last seen Hochrein 03/18/14 offered him or Pa for pt if the need was urgent  She is unclear on the order in which changing providers has to go by before appt has to be made

## 2016-02-27 NOTE — Telephone Encounter (Signed)
Called back to advise - Lauren already gone for the day. Left msg w/ operator to have her return call to set up patient for a new pt appt w/ Dr. Jens Somrenshaw.  Advised to speak to triage RN or scheduler.

## 2016-02-27 NOTE — Telephone Encounter (Signed)
Returned call to [employer] at Dr. Geoffery LyonsBlomgren's office. They wanted to refer pt back to our practice for a new finding of RBBB. Pt was suggested to see Dr. Jens Somrenshaw or Dr. Katrinka BlazingSmith as referral. Has seen Dr. Antoine PocheHochrein in past - last visit 2 years ago.  Routed to Dr. Antoine PocheHochrein for OK to change providers.

## 2016-02-29 NOTE — Progress Notes (Addendum)
HPI: 75 year old male for evaluation of abnormal electrocardiogram. Echocardiogram in 2011 showed normal LV systolic function. Septal dyssynergy from left bundle branch block. Holter monitor 2014 showed sinus rhythm with occasional PACs and PVCs. Patient seen in this office previously by Dr. Antoine Poche. He apparently has a long history of palpitations treated with beta-blockade. Since last seen, Patient denies chest pain, palpitations or syncope. Dyspnea with more extreme activities.  Current Outpatient Prescriptions  Medication Sig Dispense Refill  . ALPRAZolam (XANAX) 0.5 MG tablet Take 0.5 mg by mouth at bedtime.    Marland Kitchen aspirin 81 MG tablet Take 81 mg by mouth daily.    Marland Kitchen buPROPion (BUDEPRION XL) 300 MG 24 hr tablet once daily.    . cholecalciferol (VITAMIN D) 1000 units tablet Take 1,000 Units by mouth daily.    . finasteride (PROSCAR) 5 MG tablet Take 5 mg by mouth daily.    Marland Kitchen FLUoxetine (PROZAC) 40 MG capsule Take 40 mg by mouth daily.    Marland Kitchen HYDROcodone-acetaminophen (NORCO/VICODIN) 5-325 MG per tablet Take 1 tablet by mouth 2 (two) times daily.    . Influenza Vac Split High-Dose (FLUZONE HIGH-DOSE) 0.5 ML SUSY Inject 1 each into the muscle once.    . lamoTRIgine (LAMICTAL) 100 MG tablet Take 100 mg by mouth 2 (two) times daily.     Marland Kitchen losartan (COZAAR) 100 MG tablet Take 100 mg by mouth daily.    Marland Kitchen lovastatin (MEVACOR) 20 MG tablet Take 20 mg by mouth once.    . Multiple Vitamin (MULTIVITAMIN) tablet Take 1 tablet by mouth daily.    . pregabalin (LYRICA) 50 MG capsule Take 100 mg by mouth daily.     . propranolol (INDERAL) 10 MG tablet Take 10 mg by mouth daily.     . QUEtiapine (SEROQUEL) 50 MG tablet Take 25 mg by mouth daily.   0  . tamsulosin (FLOMAX) 0.4 MG CAPS Take 0.4 mg by mouth 2 (two) times daily.     No current facility-administered medications for this visit.    No Known Allergies   Past Medical History  Diagnosis Date  . HTN (hypertension)   . Migraine   . ADD  (attention deficit disorder)   . Gout   . Bipolar 1 disorder (HCC)   . DDD (degenerative disc disease), cervical   . Diverticulosis   . BPH (benign prostatic hyperplasia)   . Hyperlipidemia   . PVC (premature ventricular contraction)   . LBBB (left bundle branch block)   . Radiculopathy     lumbar with right leg pain  . Zoster   . Depression   . 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 Abnormality of gait 05/12/2013  . Hydrocephalus, communicating   . 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 Tonsillectomy and adenoidectomy    . Cataract extraction    . Belpharoptosis repair      Social History   Social History  . Marital Status: Married    Spouse Name: N/A  . Number of Children: 1  . Years of Education: 16   Occupational History  . [employer]    Social History Main Topics  . Smoking status: Former Smoker -- 2.00 packs/day for 12 years    Types: Cigarettes    Quit date: 07/24/1977  . Smokeless tobacco: Never Used  . Alcohol Use: 8.4 oz/week    14 Glasses of wine per week     Comment: 2 glasses wine per night  .  Drug Use: No  . Sexual Activity: Not on file   Other Topics Concern  . Not on file   Social History Narrative   Lives with his wife, Joyce GrossKay.   Patient is right handed.   Patient drinks 2 sodas daily.    Family History  Problem Relation Age of Onset  . Hypertension Father   . Stroke Father   . Bipolar disorder Brother   . Uterine cancer Mother   . Parkinsonism Maternal Uncle     ROS: no fevers or chills, productive cough, hemoptysis, dysphasia, odynophagia, melena, hematochezia, dysuria, hematuria, rash, seizure activity, orthopnea, PND, pedal edema, claudication. Remaining systems are negative.  Physical Exam:   Blood pressure 124/76, pulse 64, height 5\' 9"  (1.753 m), weight 188 lb 1.9 oz (85.331 kg).  General:  Well developed/well nourished in NAD Skin warm/dry Patient not depressed No  peripheral clubbing Back-normal HEENT-normal/normal eyelids Neck supple/normal carotid upstroke bilaterally; no bruits; no JVD; no thyromegaly chest - CTA/ normal expansion CV - RRR/normal S1 and S2; no murmurs, rubs or gallops;  PMI nondisplaced Abdomen -NT/ND, no HSM, no mass, + bowel sounds, no bruit 2+ femoral pulses, no bruits Ext-no edema, chords, 2+ DP Neuro-grossly nonfocal  ECG 02/06/2016-sinus rhythmWith left bundle branch block.

## 2016-03-05 ENCOUNTER — Telehealth: Payer: Self-pay | Admitting: Cardiology

## 2016-03-05 NOTE — Telephone Encounter (Signed)
Received [facility] -Dr Duaine DredgeBlomgren for appointment on 03/06/16 with Dr Jens Somrenshaw.  [facility] ([facility] records) for Dr Ludwig Clarksrenshaw's schedule on 03/06/16. lp

## 2016-03-06 ENCOUNTER — Encounter: Payer: Self-pay | Admitting: Cardiology

## 2016-03-06 ENCOUNTER — Ambulatory Visit (INDEPENDENT_AMBULATORY_CARE_PROVIDER_SITE_OTHER): Payer: Medicare Other | Admitting: Cardiology

## 2016-03-06 VITALS — BP 124/76 | HR 64 | Ht 69.0 in | Wt 188.1 lb

## 2016-03-06 DIAGNOSIS — R9431 Abnormal electrocardiogram [ECG] [EKG]: Secondary | ICD-10-CM

## 2016-03-06 DIAGNOSIS — R0600 Dyspnea, unspecified: Secondary | ICD-10-CM

## 2016-03-06 DIAGNOSIS — I447 Left bundle-branch block, unspecified: Secondary | ICD-10-CM

## 2016-03-06 NOTE — Assessment & Plan Note (Addendum)
Patient has a left bundle branch block and dyspnea with more extreme activities. We will arrange an echocardiogram to assess LV function and Lexiscan nuclear study to screen for ischemia. If normal we will not pursue further evaluation.

## 2016-03-06 NOTE — Assessment & Plan Note (Signed)
History of palpitations and PVCs. Continue low-dose beta blocker.Symptoms are controlled.

## 2016-03-06 NOTE — Patient Instructions (Signed)
Medication Instructions:   NO CHANGE  Testing/Procedures:  Your physician has requested that you have an echocardiogram. Echocardiography is a painless test that uses sound waves to create images of your heart. It provides your doctor with information about the size and shape of your heart and how well your heart's chambers and valves are working. This procedure takes approximately one hour. There are no restrictions for this procedure.   Your physician has requested that you have a lexiscan myoview. For further information please visit [email or web address]. Please follow instruction sheet, as given.    Follow-Up:  Your physician recommends that you schedule a follow-up appointment in: AS NEEDED PENDING TEST RESULTS      

## 2016-03-15 ENCOUNTER — Telehealth: Payer: Self-pay | Admitting: Neurology

## 2016-03-15 NOTE — Telephone Encounter (Signed)
Records sent over by Dr. Duaine DredgeBlomgren indicated the patient has been consuming at least 4-6 glasses of wine a day. 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 He is being evaluated for ventricular megaly and a gait disturbance. The patient is falling.

## 2016-03-19 ENCOUNTER — Telehealth (HOSPITAL_COMMUNITY): Payer: Self-pay | Admitting: *Deleted

## 2016-03-19 NOTE — Telephone Encounter (Signed)
Patient given detailed instructions per Myocardial Perfusion Study Information Sheet for the test on 03/22/16 at 1100. Patient notified to arrive 15 minutes early and that it is imperative to arrive on time for appointment to keep from having the test rescheduled.  If you need to cancel or reschedule your appointment, please call the office within 24 hours of your appointment. Failure to do so may result in a cancellation of your appointment, and a $50 no show fee. Patient verbalized understanding.Londynn Sonoda, Adelene IdlerCynthia W

## 2016-03-20 ENCOUNTER — Encounter: Payer: Self-pay | Admitting: Neurology

## 2016-03-20 ENCOUNTER — Ambulatory Visit (INDEPENDENT_AMBULATORY_CARE_PROVIDER_SITE_OTHER): Payer: Medicare Other | Admitting: Neurology

## 2016-03-20 VITALS — BP 116/62 | HR 74 | Ht 69.0 in | Wt 189.5 lb

## 2016-03-20 DIAGNOSIS — G9389 Other specified disorders of brain: Secondary | ICD-10-CM | POA: Diagnosis not present

## 2016-03-20 DIAGNOSIS — R269 Unspecified abnormalities of gait and mobility: Secondary | ICD-10-CM

## 2016-03-20 NOTE — Progress Notes (Signed)
Reason for visit: Gait disorder  Clayton Burke is an 75 y.o. male  History of present illness:  Clayton Burke is a 75 year old right-handed white male with a history of a gait disorder. The patient has had a worsening of his ability to ambulate over the last one month. The patient has had at least 3 falls during this period of time, he is now using a cane for ambulation. He denies any changes in his cognitive functioning level or with bladder control issues. [facility] has been to [facility] as well. He has gained improvement from lumbar puncture in the past, but it is not felt to be significant, with less than 30% benefit. The patient denies any numbness or weakness of extremities. The patient has had one fall when he went backwards down some stairs. Fortunately, he did not sustain significant injury. He returns to this office for an evaluation. Medical records from the primary care physician indicates that the patient is drinking 4-6 glasses of wine daily.  Past Medical History  Diagnosis Date  . HTN (hypertension)   . Migraine   . ADD (attention deficit disorder)   . Gout   . Bipolar 1 disorder (HCC)   . DDD (degenerative disc disease), cervical   . Diverticulosis   . BPH (benign prostatic hyperplasia)   . Hyperlipidemia   . PVC (premature ventricular contraction)   . LBBB (left bundle branch block)   . Radiculopathy     lumbar with right leg pain  . Zoster   . Depression   . 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 Abnormality of gait 05/12/2013  . Hydrocephalus, communicating   . 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 Tonsillectomy and adenoidectomy    . Cataract extraction    . Belpharoptosis repair      Family History  Problem Relation Age of Onset  . Hypertension Father   . Stroke Father   . Bipolar disorder Brother   . Uterine cancer Mother   . Parkinsonism Maternal Uncle     Social history:  reports  that he quit smoking about 38 years ago. His smoking use included Cigarettes. He has a 24 pack-year smoking history. He has never used smokeless tobacco. He reports that he drinks about 8.4 oz of alcohol per week. He reports that he does not use illicit drugs.   No Known Allergies  Medications:  Prior to Admission medications   Medication Sig Start Date End Date Taking? Authorizing Provider  ALPRAZolam Prudy Feeler(XANAX) 0.5 MG tablet Take 0.5 mg by mouth at bedtime. 11/06/13   Historical Provider, MD  aspirin 81 MG tablet Take 81 mg by mouth daily.    Historical Provider, MD  buPROPion (BUDEPRION XL) 300 MG 24 hr tablet once daily. 06/04/14   Historical Provider, MD  cholecalciferol (VITAMIN D) 1000 units tablet Take 1,000 Units by mouth daily.    Historical Provider, MD  finasteride (PROSCAR) 5 MG tablet Take 5 mg by mouth daily. 11/06/13   Historical Provider, MD  FLUoxetine (PROZAC) 40 MG capsule Take 40 mg by mouth daily.    Historical Provider, MD  HYDROcodone-acetaminophen (NORCO/VICODIN) 5-325 MG per tablet Take 1 tablet by mouth 2 (two) times daily.    Historical Provider, MD  Influenza Vac Split High-Dose (FLUZONE HIGH-DOSE) 0.5 ML SUSY Inject 1 each into the muscle once. 07/28/14   Historical Provider, MD  lamoTRIgine (LAMICTAL) 100 MG tablet Take 100 mg by mouth  2 (two) times daily.  09/06/13   Historical Provider, MD  losartan (COZAAR) 100 MG tablet Take 100 mg by mouth daily. 11/06/13   Historical Provider, MD  lovastatin (MEVACOR) 20 MG tablet Take 20 mg by mouth once. 11/06/13   Historical Provider, MD  Multiple Vitamin (MULTIVITAMIN) tablet Take 1 tablet by mouth daily.    Historical Provider, MD  pregabalin (LYRICA) 50 MG capsule Take 100 mg by mouth daily.     Historical Provider, MD  propranolol (INDERAL) 10 MG tablet Take 10 mg by mouth daily.     Historical Provider, MD  QUEtiapine (SEROQUEL) 50 MG tablet Take 25 mg by mouth daily.  07/17/15   Historical Provider, MD  tamsulosin (FLOMAX) 0.4  MG CAPS Take 0.4 mg by mouth 2 (two) times daily.    Historical Provider, MD    ROS:  Out of a complete 14 system review of symptoms, the patient complains only of the following symptoms, and all other reviewed systems are negative.  Walking difficulty Depression Bruising easily  Blood pressure 116/62, pulse 74, height  (1.753 m), weight 189 lb 8 oz (85.957 kg).  Physical Exam  General: The patient is alert and cooperative at the time of the examination.  Skin: No significant peripheral edema is noted.   Neurologic Exam  Mental status: The patient is alert and oriented x 3 at the time of the examination. The patient has apparent normal recent and remote memory, with an apparently normal attention span and concentration ability.   Cranial nerves: Facial symmetry is present. Speech is normal, no aphasia or dysarthria is noted. Extraocular movements are full. Visual fields are full.  Motor: The patient has good strength in all 4 extremities.  Sensory examination: Soft touch sensation is symmetric on the face, arms, and legs.  Coordination: The patient has good finger-nose-finger and heel-to-shin bilaterally. The patient does have mild action tremors with both upper extremities.  Gait and station: The patient has a slightly wide-based gait, the patient shuffles his feet, particularly with turns. Tandem gait is minimally unsteady. Romberg is negative. With a timed walk test, the patient was able to do 6 fifteen foot laps in 40 seconds.  Reflexes: Deep tendon reflexes are symmetric.   CT head 10/20/15:  IMPRESSION:  Abnormal CT head (without) demonstrating: 1. Moderate frontal and perisylvian atrophy.  2. Moderate-severe ventriculomegaly likely on ex vacuo basis. 3. No acute findings. 4. Compared to MRI on 04/14/13, atrophy and ventriculomegaly have slightly increased.  * CT scan images were reviewed online. I agree with the written report.    Assessment/Plan:  1.  Gait disorder, possible normal pressure hydrocephalus  The patient has already contacted [facility], they may get him up to get an evaluation in the near future. The patient has had a change in his walking, clearly he is more wide-based, and is having more problems with shuffling, indicates that his gait has slowed down. I have discussed the issue with alcohol intake, this may compound the picture with ambulation as chronic alcohol use may result in chronic issues with balance and gait. The patient will contact me if he wishes to have another lumbar puncture in this area versus at [facility]. Otherwise, he has a revisit in October 2017. The patient is also on Lyrica which may worsen balance.  Marlan Palau MD 03/20/2016 9:00 AM  [facility] [address] [place], [place] [address]  Phone [phone] Fax [phone]

## 2016-03-20 NOTE — Patient Instructions (Signed)
Fall Prevention in the Home  Falls can cause injuries and can affect people from all age groups. There are many simple things that you can do to make your home safe and to help prevent falls. WHAT CAN I DO ON THE OUTSIDE OF MY HOME?  Regularly repair the edges of walkways and driveways and fix any cracks.  Remove high doorway thresholds.  Trim any shrubbery on the main path into your home.  Use bright outdoor lighting.  Clear walkways of debris and clutter, including tools and rocks.  Regularly check that handrails are securely fastened and in good repair. Both sides of any steps should have handrails.  Install guardrails along the edges of any raised decks or porches.  Have leaves, snow, and ice cleared regularly.  Use sand or salt on walkways during winter months.  In the garage, clean up any spills right away, including grease or oil spills. WHAT CAN I DO IN THE BATHROOM?  Use night lights.  Install grab bars by the toilet and in the tub and shower. Do not use towel bars as grab bars.  Use non-skid mats or decals on the floor of the tub or shower.  If you need to sit down while you are in the shower, use a plastic, non-slip stool..  Keep the floor dry. Immediately clean up any water that spills on the floor.  Remove soap buildup in the tub or shower on a regular basis.  Attach bath mats securely with double-sided non-slip rug tape.  Remove throw rugs and other tripping hazards from the floor. WHAT CAN I DO IN THE BEDROOM?  Use night lights.  Make sure that a bedside light is easy to reach.  Do not use oversized bedding that drapes onto the floor.  Have a firm chair that has side arms to use for getting dressed.  Remove throw rugs and other tripping hazards from the floor. WHAT CAN I DO IN THE KITCHEN?   Clean up any spills right away.  Avoid walking on wet floors.  Place frequently used items in easy-to-reach places.  If you need to reach for something  above you, use a sturdy step stool that has a grab bar.  Keep electrical cables out of the way.  Do not use floor polish or wax that makes floors slippery. If you have to use wax, make sure that it is non-skid floor wax.  Remove throw rugs and other tripping hazards from the floor. WHAT CAN I DO IN THE STAIRWAYS?  Do not leave any items on the stairs.  Make sure that there are handrails on both sides of the stairs. Fix handrails that are broken or loose. Make sure that handrails are as long as the stairways.  Check any carpeting to make sure that it is firmly attached to the stairs. Fix any carpet that is loose or worn.  Avoid having throw rugs at the top or bottom of stairways, or secure the rugs with carpet tape to prevent them from moving.  Make sure that you have a light switch at the top of the stairs and the bottom of the stairs. If you do not have them, have them installed. WHAT ARE SOME OTHER FALL PREVENTION TIPS?  Wear closed-toe shoes that fit well and support your feet. Wear shoes that have rubber soles or low heels.  When you use a stepladder, make sure that it is completely opened and that the sides are firmly locked. Have someone hold the ladder while you   are using it. Do not climb a closed stepladder.  Add color or contrast paint or tape to grab bars and handrails in your home. Place contrasting color strips on the first and last steps.  Use mobility aids as needed, such as canes, walkers, scooters, and crutches.  Turn on lights if it is dark. Replace any light bulbs that burn out.  Set up furniture so that there are clear paths. Keep the furniture in the same spot.  Fix any uneven floor surfaces.  Choose a carpet design that does not hide the edge of steps of a stairway.  Be aware of any and all pets.  Review your medicines with your healthcare provider. Some medicines can cause dizziness or changes in blood pressure, which increase your risk of falling. Talk  with your health care provider about other ways that you can decrease your risk of falls. This may include working with a physical therapist or trainer to improve your strength, balance, and endurance.   This information is not intended to replace advice given to you by your health care provider. Make sure you discuss any questions you have with your health care provider.   Document Released: 10/04/2002 Document Revised: 02/28/2015 Document Reviewed: 11/18/2014 Elsevier Interactive Patient Education 2016 [employer].  

## 2016-03-22 ENCOUNTER — Ambulatory Visit (HOSPITAL_BASED_OUTPATIENT_CLINIC_OR_DEPARTMENT_OTHER): Payer: Medicare Other

## 2016-03-22 ENCOUNTER — Ambulatory Visit (HOSPITAL_COMMUNITY): Payer: Medicare Other | Attending: Cardiology

## 2016-03-22 ENCOUNTER — Other Ambulatory Visit: Payer: Self-pay

## 2016-03-22 DIAGNOSIS — R0600 Dyspnea, unspecified: Secondary | ICD-10-CM | POA: Diagnosis not present

## 2016-03-22 DIAGNOSIS — E785 Hyperlipidemia, unspecified: Secondary | ICD-10-CM | POA: Diagnosis not present

## 2016-03-22 DIAGNOSIS — I447 Left bundle-branch block, unspecified: Secondary | ICD-10-CM

## 2016-03-22 DIAGNOSIS — R9439 Abnormal result of other cardiovascular function study: Secondary | ICD-10-CM | POA: Diagnosis not present

## 2016-03-22 DIAGNOSIS — R0609 Other forms of dyspnea: Secondary | ICD-10-CM | POA: Insufficient documentation

## 2016-03-22 DIAGNOSIS — R9431 Abnormal electrocardiogram [ECG] [EKG]: Secondary | ICD-10-CM | POA: Insufficient documentation

## 2016-03-22 DIAGNOSIS — I10 Essential (primary) hypertension: Secondary | ICD-10-CM | POA: Insufficient documentation

## 2016-03-22 DIAGNOSIS — I340 Nonrheumatic mitral (valve) insufficiency: Secondary | ICD-10-CM | POA: Diagnosis not present

## 2016-03-22 DIAGNOSIS — I493 Ventricular premature depolarization: Secondary | ICD-10-CM | POA: Diagnosis not present

## 2016-03-22 DIAGNOSIS — R0602 Shortness of breath: Secondary | ICD-10-CM | POA: Insufficient documentation

## 2016-03-22 DIAGNOSIS — Z87891 Personal history of nicotine dependence: Secondary | ICD-10-CM | POA: Insufficient documentation

## 2016-03-22 DIAGNOSIS — I371 Nonrheumatic pulmonary valve insufficiency: Secondary | ICD-10-CM | POA: Diagnosis not present

## 2016-03-22 DIAGNOSIS — I071 Rheumatic tricuspid insufficiency: Secondary | ICD-10-CM | POA: Diagnosis not present

## 2016-03-22 LAB — MYOCARDIAL PERFUSION IMAGING
CHL CUP NUCLEAR SDS: 3
CHL CUP NUCLEAR SRS: 12
CHL CUP NUCLEAR SSS: 15
LHR: 0.33
LV dias vol: 102 mL (ref 62–150)
LV sys vol: 59 mL
Peak HR: 70 {beats}/min
Rest HR: 51 {beats}/min
TID: 1.07

## 2016-03-22 MED ORDER — TECHNETIUM TC 99M TETROFOSMIN IV KIT
31.5000 | PACK | Freq: Once | INTRAVENOUS | Status: AC | PRN
  Administered 2016-03-22: 32 via INTRAVENOUS
  Filled 2016-03-22: qty 32

## 2016-03-22 MED ORDER — REGADENOSON 0.4 MG/5ML IV SOLN
0.4000 mg | Freq: Once | INTRAVENOUS | Status: AC
  Administered 2016-03-22: 0.4 mg via INTRAVENOUS

## 2016-03-22 MED ORDER — TECHNETIUM TC 99M TETROFOSMIN IV KIT
10.3000 | PACK | Freq: Once | INTRAVENOUS | Status: AC | PRN
  Administered 2016-03-22: 10 via INTRAVENOUS
  Filled 2016-03-22: qty 10

## 2016-03-26 ENCOUNTER — Telehealth: Payer: Self-pay | Admitting: Cardiology

## 2016-03-26 ENCOUNTER — Encounter: Payer: Self-pay | Admitting: Cardiology

## 2016-03-26 NOTE — Telephone Encounter (Signed)
New message     The pt wants to speak with a nurse about the stress test results

## 2016-03-26 NOTE — Telephone Encounter (Signed)
Spoke with pt, okay given for pt to continue to exercise.

## 2016-03-26 NOTE — Telephone Encounter (Signed)
This encounter was created in error - please disregard.

## 2016-03-26 NOTE — Telephone Encounter (Signed)
New message    The pt is making sure it is ok to exercise the pt wants to speak with a nurse

## 2016-05-08 NOTE — Progress Notes (Signed)
HPI: FU abnormal electrocardiogram. Holter monitor 2014 showed sinus rhythm with occasional PACs and PVCs. Patient has a long history of palpitations treated with beta-blockade. Nuclear study May 2017 showed ejection fraction 42%. There was prior anteroseptal infarct versus left bundle branch block artifact with mild ischemia towards the apex. There was inferior thinning with mild inferior ischemia. Echocardiogram May 2017 showed normal LV systolic function, grade 1 diastolic dysfunction, mild mitral regurgitation. Since last seen, He denies dyspnea, chest pain, palpitations or syncope.  Current Outpatient Prescriptions  Medication Sig Dispense Refill  . ALPRAZolam (XANAX) 0.5 MG tablet Take 0.5 mg by mouth at bedtime.    Marland Kitchen aspirin 81 MG tablet Take 81 mg by mouth daily.    Marland Kitchen buPROPion (BUDEPRION XL) 300 MG 24 hr tablet once daily.    . cholecalciferol (VITAMIN D) 1000 units tablet Take 1,000 Units by mouth daily.    . finasteride (PROSCAR) 5 MG tablet Take 5 mg by mouth daily.    Marland Kitchen FLUoxetine (PROZAC) 40 MG capsule Take 40 mg by mouth daily.    Marland Kitchen HYDROcodone-acetaminophen (NORCO/VICODIN) 5-325 MG per tablet Take 1 tablet by mouth 2 (two) times daily.    . Influenza Vac Split High-Dose (FLUZONE HIGH-DOSE) 0.5 ML SUSY Inject 1 each into the muscle once.    . lamoTRIgine (LAMICTAL) 100 MG tablet Take 100 mg by mouth 2 (two) times daily.     Marland Kitchen losartan (COZAAR) 100 MG tablet Take 100 mg by mouth daily.    Marland Kitchen lovastatin (MEVACOR) 20 MG tablet Take 20 mg by mouth once.    . Multiple Vitamin (MULTIVITAMIN) tablet Take 1 tablet by mouth daily.    . pregabalin (LYRICA) 50 MG capsule Take 100 mg by mouth daily.     . propranolol (INDERAL) 10 MG tablet Take 10 mg by mouth daily.     . QUEtiapine (SEROQUEL) 25 MG tablet Take 25 mg by mouth at bedtime.    . tamsulosin (FLOMAX) 0.4 MG CAPS Take 0.4 mg by mouth 2 (two) times daily.     No current facility-administered medications for this visit.      Past Medical History  Diagnosis Date  . HTN (hypertension)   . Migraine   . ADD (attention deficit disorder)   . Gout   . Bipolar 1 disorder (HCC)   . DDD (degenerative disc disease), cervical   . Diverticulosis   . BPH (benign prostatic hyperplasia)   . Hyperlipidemia   . PVC (premature ventricular contraction)   . LBBB (left bundle branch block)   . Radiculopathy     lumbar with right leg pain  . Zoster   . Depression   . 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 Abnormality of gait 05/12/2013  . Hydrocephalus, communicating   . 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 Tonsillectomy and adenoidectomy    . Cataract extraction    . Belpharoptosis repair      Social History   Social History  . Marital Status: Married    Spouse Name: N/A  . Number of Children: 1  . Years of Education: 16   Occupational History  . [employer]    Social History Main Topics  . Smoking status: Former Smoker -- 2.00 packs/day for 12 years    Types: Cigarettes    Quit date: 07/24/1977  . Smokeless tobacco: Never Used  . Alcohol Use: 8.4 oz/week    14 Glasses of wine  per week     Comment: 2 glasses wine per night  . Drug Use: No  . Sexual Activity: Not on file   Other Topics Concern  . Not on file   Social History Narrative   Lives with his wife, Joyce GrossKay.   Patient is right handed.   Patient drinks 2 sodas daily.    Family History  Problem Relation Age of Onset  . Hypertension Father   . Stroke Father   . Bipolar disorder Brother   . Uterine cancer Mother   . Parkinsonism Maternal Uncle     ROS: no fevers or chills, productive cough, hemoptysis, dysphasia, odynophagia, melena, hematochezia, dysuria, hematuria, rash, seizure activity, orthopnea, PND, pedal edema, claudication. Remaining systems are negative.  Physical Exam: Well-developed well-nourished in no acute distress.  Skin is warm and dry.  HEENT is normal.   Neck is supple.  Chest is clear to auscultation with normal expansion.  Cardiovascular exam is regular rate and rhythm.  Abdominal exam nontender or distended. No masses palpated. Extremities show no edema. neuro grossly intact  A/P  1 Abnormal nuclear study-I have reviewed the patient's nuclear study with him in depth. His defect may be related to left bundle branch block but there also appears to be reversibility. There is also mild inferior ischemia. He is scheduled to have a [facility]unt put in [facility] at [facility] in approximately 3 weeks. This will require general anesthesia. I therefore feel that we should be definitive in evaluating his cardiac status given abnormal nuclear study. We will plan cardiac catheterization. The risks and benefits were discussed including myocardial infarctions, CVA and death and he agrees to proceed.  2 left bundle branch block-as per #1.  3 palpitations-controlled. Continue beta blocker.  Olga MillersBrian Crenshaw, MD

## 2016-05-13 ENCOUNTER — Telehealth: Payer: Self-pay | Admitting: Neurology

## 2016-05-13 DIAGNOSIS — G912 (Idiopathic) normal pressure hydrocephalus: Secondary | ICD-10-CM

## 2016-05-13 NOTE — Telephone Encounter (Signed)
Patient called today, advised he saw a [facility] for a [facility], they want to do surgery next month, patient would like to talk to Dr. Anne HahnWillis about this before proceeding with surgery. Is aware Dr. Anne HahnWillis is out of the office until Wednesday.

## 2016-05-14 ENCOUNTER — Encounter: Payer: Self-pay | Admitting: Cardiology

## 2016-05-14 ENCOUNTER — Other Ambulatory Visit: Payer: Self-pay | Admitting: Cardiology

## 2016-05-14 ENCOUNTER — Ambulatory Visit (INDEPENDENT_AMBULATORY_CARE_PROVIDER_SITE_OTHER): Payer: Medicare Other | Admitting: Cardiology

## 2016-05-14 VITALS — BP 130/68 | HR 62 | Ht 69.0 in | Wt 192.0 lb

## 2016-05-14 DIAGNOSIS — I493 Ventricular premature depolarization: Secondary | ICD-10-CM

## 2016-05-14 DIAGNOSIS — R9439 Abnormal result of other cardiovascular function study: Secondary | ICD-10-CM

## 2016-05-14 DIAGNOSIS — I447 Left bundle-branch block, unspecified: Secondary | ICD-10-CM

## 2016-05-14 DIAGNOSIS — R931 Abnormal findings on diagnostic imaging of heart and coronary circulation: Secondary | ICD-10-CM

## 2016-05-14 DIAGNOSIS — R9430 Abnormal result of cardiovascular function study, unspecified: Secondary | ICD-10-CM

## 2016-05-14 DIAGNOSIS — Z01818 Encounter for other preprocedural examination: Secondary | ICD-10-CM | POA: Diagnosis not present

## 2016-05-14 NOTE — Telephone Encounter (Signed)
I called the patient. He had another LP, he gained good improvement, he will be having VP shunt placement soon.

## 2016-05-14 NOTE — Patient Instructions (Signed)
Your physician has requested that you have a cardiac catheterization. Cardiac catheterization is used to diagnose and/or treat various heart conditions. Doctors may recommend this procedure for a number of different reasons. The most common reason is to evaluate chest pain. Chest pain can be a symptom of coronary artery disease (CAD), and cardiac catheterization can show whether plaque is narrowing or blocking your heart's arteries. This procedure is also used to evaluate the valves, as well as measure the blood flow and oxygen levels in different parts of your heart. For further information please visit [email or web address].    Your physician recommends that you return for lab work today.

## 2016-05-14 NOTE — Telephone Encounter (Signed)
I called the patient and I left a message. I will call him back.

## 2016-05-15 LAB — BASIC METABOLIC PANEL WITH GFR
BUN: 22 mg/dL (ref 7–25)
CHLORIDE: 106 mmol/L (ref 98–110)
CO2: 26 mmol/L (ref 20–31)
CREATININE: 1.12 mg/dL (ref 0.70–1.18)
Calcium: 8.9 mg/dL (ref 8.6–10.3)
GFR, EST NON AFRICAN AMERICAN: 64 mL/min (ref >=60)
GFR, Est African American: 74 mL/min (ref >=60)
Glucose, Bld: 87 mg/dL (ref 65–99)
POTASSIUM: 5 mmol/L (ref 3.5–5.3)
SODIUM: 140 mmol/L (ref 135–146)

## 2016-05-15 LAB — CBC
HCT: 43.7 % (ref 38.5–50.0)
Hemoglobin: 14.6 g/dL (ref 13.2–17.1)
MCH: 31.6 pg (ref 27.0–33.0)
MCHC: 33.4 g/dL (ref 32.0–36.0)
MCV: 94.6 fL (ref 80.0–100.0)
MPV: 9.7 fL (ref 7.5–12.5)
PLATELETS: 171 10*3/uL (ref 140–400)
RBC: 4.62 MIL/uL (ref 4.20–5.80)
RDW: 13.7 % (ref 11.0–15.0)
WBC: 5.9 10*3/uL (ref 3.8–10.8)

## 2016-05-15 LAB — PROTIME-INR
INR: 1
PROTHROMBIN TIME: 10.3 s (ref 9.0–11.5)

## 2016-05-15 NOTE — Telephone Encounter (Signed)
Patient called back, would like for Dr. Anne HahnWillis to recommend 1 or 2 Neurosurgeons's in [place]. Please call [phone].

## 2016-05-15 NOTE — Addendum Note (Signed)
Addended by: Stephanie AcreWILLIS, CHARLES on: 05/15/2016 05:29 PM   Modules accepted: Orders

## 2016-05-15 NOTE — Telephone Encounter (Signed)
I called patient. I'll get a referral set up for one of the neurosurgeons in town.

## 2016-05-16 ENCOUNTER — Encounter (HOSPITAL_COMMUNITY)
Admission: RE | Disposition: A | Discharge status: Home / Self Care | Payer: Self-pay | Source: Ambulatory Visit | Attending: Interventional Cardiology

## 2016-05-16 ENCOUNTER — Ambulatory Visit (HOSPITAL_COMMUNITY)
Admission: RE | Admit: 2016-05-16 | Discharge: 2016-05-16 | Disposition: A | Discharge status: Home / Self Care | Payer: Medicare Other | Source: Ambulatory Visit | Attending: Interventional Cardiology | Admitting: Interventional Cardiology

## 2016-05-16 ENCOUNTER — Encounter (HOSPITAL_COMMUNITY): Payer: Self-pay | Admitting: Interventional Cardiology

## 2016-05-16 DIAGNOSIS — R9439 Abnormal result of other cardiovascular function study: Secondary | ICD-10-CM | POA: Insufficient documentation

## 2016-05-16 DIAGNOSIS — F319 Bipolar disorder, unspecified: Secondary | ICD-10-CM | POA: Diagnosis not present

## 2016-05-16 DIAGNOSIS — G219 Secondary parkinsonism, unspecified: Secondary | ICD-10-CM | POA: Diagnosis not present

## 2016-05-16 DIAGNOSIS — Z01818 Encounter for other preprocedural examination: Secondary | ICD-10-CM | POA: Insufficient documentation

## 2016-05-16 DIAGNOSIS — R931 Abnormal findings on diagnostic imaging of heart and coronary circulation: Secondary | ICD-10-CM

## 2016-05-16 DIAGNOSIS — Z87891 Personal history of nicotine dependence: Secondary | ICD-10-CM | POA: Insufficient documentation

## 2016-05-16 DIAGNOSIS — N400 Enlarged prostate without lower urinary tract symptoms: Secondary | ICD-10-CM | POA: Diagnosis not present

## 2016-05-16 DIAGNOSIS — M109 Gout, unspecified: Secondary | ICD-10-CM | POA: Diagnosis not present

## 2016-05-16 DIAGNOSIS — M5030 Other cervical disc degeneration, unspecified cervical region: Secondary | ICD-10-CM | POA: Insufficient documentation

## 2016-05-16 DIAGNOSIS — E785 Hyperlipidemia, unspecified: Secondary | ICD-10-CM | POA: Insufficient documentation

## 2016-05-16 DIAGNOSIS — R002 Palpitations: Secondary | ICD-10-CM | POA: Diagnosis not present

## 2016-05-16 DIAGNOSIS — Z823 Family history of stroke: Secondary | ICD-10-CM | POA: Diagnosis not present

## 2016-05-16 DIAGNOSIS — Z8249 Family history of ischemic heart disease and other diseases of the circulatory system: Secondary | ICD-10-CM | POA: Diagnosis not present

## 2016-05-16 DIAGNOSIS — I10 Essential (primary) hypertension: Secondary | ICD-10-CM | POA: Diagnosis not present

## 2016-05-16 DIAGNOSIS — I493 Ventricular premature depolarization: Secondary | ICD-10-CM

## 2016-05-16 DIAGNOSIS — I447 Left bundle-branch block, unspecified: Secondary | ICD-10-CM | POA: Insufficient documentation

## 2016-05-16 DIAGNOSIS — G43909 Migraine, unspecified, not intractable, without status migrainosus: Secondary | ICD-10-CM | POA: Insufficient documentation

## 2016-05-16 DIAGNOSIS — G910 Communicating hydrocephalus: Secondary | ICD-10-CM | POA: Insufficient documentation

## 2016-05-16 DIAGNOSIS — Z7982 Long term (current) use of aspirin: Secondary | ICD-10-CM | POA: Diagnosis not present

## 2016-05-16 HISTORY — PX: CARDIAC CATHETERIZATION: SHX172

## 2016-05-16 SURGERY — LEFT HEART CATH AND CORONARY ANGIOGRAPHY

## 2016-05-16 MED ORDER — VERAPAMIL HCL 2.5 MG/ML IV SOLN
INTRAVENOUS | Status: DC | PRN
  Administered 2016-05-16: 10 mL via INTRA_ARTERIAL

## 2016-05-16 MED ORDER — SODIUM CHLORIDE 0.9 % WEIGHT BASED INFUSION: 1.0000 mL/kg/h | INTRAVENOUS | Status: DC

## 2016-05-16 MED ORDER — MIDAZOLAM HCL 2 MG/2ML IJ SOLN
INTRAMUSCULAR | Status: AC
  Filled 2016-05-16: qty 2

## 2016-05-16 MED ORDER — FENTANYL CITRATE (PF) 100 MCG/2ML IJ SOLN
INTRAMUSCULAR | Status: DC | PRN
  Administered 2016-05-16 (×3): 25 ug via INTRAVENOUS

## 2016-05-16 MED ORDER — MIDAZOLAM HCL 2 MG/2ML IJ SOLN
INTRAMUSCULAR | Status: DC | PRN
  Administered 2016-05-16 (×3): 1 mg via INTRAVENOUS

## 2016-05-16 MED ORDER — ASPIRIN 81 MG PO CHEW
81.0000 mg | CHEWABLE_TABLET | ORAL | Status: AC
  Administered 2016-05-16: 81 mg via ORAL

## 2016-05-16 MED ORDER — SODIUM CHLORIDE 0.9 % IV SOLN: 250.0000 mL | INTRAVENOUS | Status: DC | PRN

## 2016-05-16 MED ORDER — HEPARIN (PORCINE) IN NACL 2-0.9 UNIT/ML-% IJ SOLN
INTRAMUSCULAR | Status: AC
  Filled 2016-05-16: qty 1000

## 2016-05-16 MED ORDER — SODIUM CHLORIDE 0.9% FLUSH: 3.0000 mL | INTRAVENOUS | Status: DC | PRN

## 2016-05-16 MED ORDER — LIDOCAINE HCL (PF) 1 % IJ SOLN
INTRAMUSCULAR | Status: AC
  Filled 2016-05-16: qty 30

## 2016-05-16 MED ORDER — SODIUM CHLORIDE 0.9 % WEIGHT BASED INFUSION
3.0000 mL/kg/h | INTRAVENOUS | Status: DC
  Administered 2016-05-16: 3 mL/kg/h via INTRAVENOUS

## 2016-05-16 MED ORDER — IOPAMIDOL (ISOVUE-370) INJECTION 76%
INTRAVENOUS | Status: DC | PRN
  Administered 2016-05-16: 30 mL via INTRA_ARTERIAL

## 2016-05-16 MED ORDER — HEPARIN SODIUM (PORCINE) 1000 UNIT/ML IJ SOLN
INTRAMUSCULAR | Status: AC
  Filled 2016-05-16: qty 1

## 2016-05-16 MED ORDER — HEPARIN (PORCINE) IN NACL 2-0.9 UNIT/ML-% IJ SOLN
INTRAMUSCULAR | Status: DC | PRN
  Administered 2016-05-16: 1000 mL

## 2016-05-16 MED ORDER — VERAPAMIL HCL 2.5 MG/ML IV SOLN
INTRAVENOUS | Status: AC
  Filled 2016-05-16: qty 2

## 2016-05-16 MED ORDER — FENTANYL CITRATE (PF) 100 MCG/2ML IJ SOLN
INTRAMUSCULAR | Status: AC
  Filled 2016-05-16: qty 2

## 2016-05-16 MED ORDER — LIDOCAINE HCL (PF) 1 % IJ SOLN
INTRAMUSCULAR | Status: DC | PRN
  Administered 2016-05-16: 3 mL via INTRADERMAL

## 2016-05-16 MED ORDER — HEPARIN SODIUM (PORCINE) 1000 UNIT/ML IJ SOLN
INTRAMUSCULAR | Status: DC | PRN
  Administered 2016-05-16: 4500 [IU] via INTRAVENOUS

## 2016-05-16 MED ORDER — SODIUM CHLORIDE 0.9% FLUSH: 3.0000 mL | Freq: Two times a day (BID) | INTRAVENOUS | Status: DC

## 2016-05-16 MED ORDER — ASPIRIN 81 MG PO CHEW
CHEWABLE_TABLET | ORAL | Status: AC
  Filled 2016-05-16: qty 1

## 2016-05-16 SURGICAL SUPPLY — 13 items
CATH INFINITI 5 FR JL3.5 (CATHETERS) ×2 IMPLANT
CATH INFINITI 5FR ANG PIGTAIL (CATHETERS) ×2 IMPLANT
CATH INFINITI JR4 5F (CATHETERS) ×2 IMPLANT
COVER PRB 48X5XTLSCP FOLD TPE (BAG) ×1 IMPLANT
COVER PROBE 5X48 (BAG) ×1
DEVICE RAD COMP TR BAND LRG (VASCULAR PRODUCTS) ×2 IMPLANT
GLIDESHEATH SLEND SS 6F .021 (SHEATH) ×2 IMPLANT
KIT HEART LEFT (KITS) ×2 IMPLANT
PACK CARDIAC CATHETERIZATION (CUSTOM PROCEDURE TRAY) ×2 IMPLANT
SYR MEDRAD MARK V 150ML (SYRINGE) ×2 IMPLANT
TRANSDUCER W/STOPCOCK (MISCELLANEOUS) ×2 IMPLANT
TUBING CIL FLEX 10 FLL-RA (TUBING) ×2 IMPLANT
WIRE SAFE-T 1.5MM-J .035X260CM (WIRE) ×2 IMPLANT

## 2016-05-16 NOTE — Research (Signed)
LEADERS FREE II Informed Consent   Subject Name: Clayton Burke  Subject met inclusion and exclusion criteria.  The informed consent form, study requirements and expectations were reviewed with the subject and questions and concerns were addressed prior to the signing of the consent form.  The subject verbalized understanding of the trail requirements.  The subject agreed to participate in the LEADERS FREE II trial and signed the informed consent.  The informed consent was obtained prior to performance of any protocol-specific procedures for the subject.  A copy of the signed informed consent was given to the subject and a copy was placed in the subject's medical record. If [name] meets angiographic criteria he could be a possible candidate for LEADERS FREE II.  [name] 05/16/2016, 10:22 AM

## 2016-05-16 NOTE — Interval H&P Note (Signed)
Cath Lab Visit (complete for each Cath Lab visit)  Clinical Evaluation Leading to the Procedure:   ACS: No.  Non-ACS:    Anginal Classification: CCS III  Anti-ischemic medical therapy: Minimal Therapy (1 class of medications)  Non-Invasive Test Results: Intermediate-risk stress test findings: cardiac mortality 1-3%/year  Prior CABG: No previous CABG      History and Physical Interval Note:  05/16/2016 10:11 AM  Clayton Burke  has presented today for surgery, with the diagnosis of abnormal nuc  The various methods of treatment have been discussed with the patient and family. After consideration of risks, benefits and other options for treatment, the patient has consented to  Procedure(s): Left Heart Cath and Coronary Angiography (N/A) as a surgical intervention .  The patient's history has been reviewed, patient examined, no change in status, stable for surgery.  I have reviewed the patient's chart and labs.  Questions were answered to the patient's satisfaction.     Lance MussJayadeep Karlye Ihrig

## 2016-05-16 NOTE — H&P (View-Only) (Signed)
    HPI: FU abnormal electrocardiogram. Holter monitor 2014 showed sinus rhythm with occasional PACs and PVCs. Patient has a long history of palpitations treated with beta-blockade. Nuclear study May 2017 showed ejection fraction 42%. There was prior anteroseptal infarct versus left bundle branch block artifact with mild ischemia towards the apex. There was inferior thinning with mild inferior ischemia. Echocardiogram May 2017 showed normal LV systolic function, grade 1 diastolic dysfunction, mild mitral regurgitation. Since last seen, He denies dyspnea, chest pain, palpitations or syncope.  Current Outpatient Prescriptions  Medication Sig Dispense Refill  . ALPRAZolam (XANAX) 0.5 MG tablet Take 0.5 mg by mouth at bedtime.    . aspirin 81 MG tablet Take 81 mg by mouth daily.    . buPROPion (BUDEPRION XL) 300 MG 24 hr tablet once daily.    . cholecalciferol (VITAMIN D) 1000 units tablet Take 1,000 Units by mouth daily.    . finasteride (PROSCAR) 5 MG tablet Take 5 mg by mouth daily.    . FLUoxetine (PROZAC) 40 MG capsule Take 40 mg by mouth daily.    . HYDROcodone-acetaminophen (NORCO/VICODIN) 5-325 MG per tablet Take 1 tablet by mouth 2 (two) times daily.    . Influenza Vac Split High-Dose (FLUZONE HIGH-DOSE) 0.5 ML SUSY Inject 1 each into the muscle once.    . lamoTRIgine (LAMICTAL) 100 MG tablet Take 100 mg by mouth 2 (two) times daily.     . losartan (COZAAR) 100 MG tablet Take 100 mg by mouth daily.    . lovastatin (MEVACOR) 20 MG tablet Take 20 mg by mouth once.    . Multiple Vitamin (MULTIVITAMIN) tablet Take 1 tablet by mouth daily.    . pregabalin (LYRICA) 50 MG capsule Take 100 mg by mouth daily.     . propranolol (INDERAL) 10 MG tablet Take 10 mg by mouth daily.     . QUEtiapine (SEROQUEL) 25 MG tablet Take 25 mg by mouth at bedtime.    . tamsulosin (FLOMAX) 0.4 MG CAPS Take 0.4 mg by mouth 2 (two) times daily.     No current facility-administered medications for this visit.      Past Medical History  Diagnosis Date  . HTN (hypertension)   . Migraine   . ADD (attention deficit disorder)   . Gout   . Bipolar 1 disorder (HCC)   . DDD (degenerative disc disease), cervical   . Diverticulosis   . BPH (benign prostatic hyperplasia)   . Hyperlipidemia   . PVC (premature ventricular contraction)   . LBBB (left bundle branch block)   . Radiculopathy     lumbar with right leg pain  . Zoster   . Depression   . 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 Abnormality of gait 05/12/2013  . Hydrocephalus, communicating   . 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 Tonsillectomy and adenoidectomy    . Cataract extraction    . Belpharoptosis repair      Social History   Social History  . Marital Status: Married    Spouse Name: N/A  . Number of Children: 1  . Years of Education: 16   Occupational History  . [employer]    Social History Main Topics  . Smoking status: Former Smoker -- 2.00 packs/day for 12 years    Types: Cigarettes    Quit date: 07/24/1977  . Smokeless tobacco: Never Used  . Alcohol Use: 8.4 oz/week    14 Glasses of wine   per week     Comment: 2 glasses wine per night  . Drug Use: No  . Sexual Activity: Not on file   Other Topics Concern  . Not on file   Social History Narrative   Lives with his wife, Joyce GrossKay.   Patient is right handed.   Patient drinks 2 sodas daily.    Family History  Problem Relation Age of Onset  . Hypertension Father   . Stroke Father   . Bipolar disorder Brother   . Uterine cancer Mother   . Parkinsonism Maternal Uncle     ROS: no fevers or chills, productive cough, hemoptysis, dysphasia, odynophagia, melena, hematochezia, dysuria, hematuria, rash, seizure activity, orthopnea, PND, pedal edema, claudication. Remaining systems are negative.  Physical Exam: Well-developed well-nourished in no acute distress.  Skin is warm and dry.  HEENT is normal.   Neck is supple.  Chest is clear to auscultation with normal expansion.  Cardiovascular exam is regular rate and rhythm.  Abdominal exam nontender or distended. No masses palpated. Extremities show no edema. neuro grossly intact  A/P  1 Abnormal nuclear study-I have reviewed the patient's nuclear study with him in depth. His defect may be related to left bundle branch block but there also appears to be reversibility. There is also mild inferior ischemia. He is scheduled to have a shunt put in [facility] at [facility] in approximately 3 weeks. This will require general anesthesia. I therefore feel that we should be definitive in evaluating his cardiac status given abnormal nuclear study. We will plan cardiac catheterization. The risks and benefits were discussed including myocardial infarctions, CVA and death and he agrees to proceed.  2 left bundle branch block-as per #1.  3 palpitations-controlled. Continue beta blocker.  Olga MillersBrian Crenshaw, MD

## 2016-05-16 NOTE — Discharge Instructions (Signed)
Radial Site Care °Refer to this sheet in the next few weeks. These instructions provide you with information about caring for yourself after your procedure. Your health care provider may also give you more specific instructions. Your treatment has been planned according to current medical practices, but problems sometimes occur. Call your health care provider if you have any problems or questions after your procedure. °WHAT TO EXPECT AFTER THE PROCEDURE °After your procedure, it is typical to have the following: °· Bruising at the radial site that usually fades within 1-2 weeks. °· Blood collecting in the tissue (hematoma) that may be painful to the touch. It should usually decrease in size and tenderness within 1-2 weeks. °HOME CARE INSTRUCTIONS °· Take medicines only as directed by your health care provider. °· You may shower 24-48 hours after the procedure or as directed by your health care provider. Remove the bandage (dressing) and gently wash the site with plain soap and water. Pat the area dry with a clean towel. Do not rub the site, because this may cause bleeding. °· Do not take baths, swim, or use a hot tub until your health care provider approves. °· Check your insertion site every day for redness, swelling, or drainage. °· Do not apply powder or lotion to the site. °· Do not flex or bend the affected arm for 24 hours or as directed by your health care provider. °· Do not push or pull heavy objects with the affected arm for 24 hours or as directed by your health care provider. °· Do not lift over 10 lb (4.5 kg) for 5 days after your procedure or as directed by your health care provider. °· Ask your health care provider when it is okay to: °¨ Return to work or school. °¨ Resume usual physical activities or sports. °¨ Resume sexual activity. °· Do not drive home if you are discharged the same day as the procedure. Have someone else drive you. °· You may drive 24 hours after the procedure unless otherwise  instructed by your health care provider. °· Do not operate machinery or power tools for 24 hours after the procedure. °· If your procedure was done as an outpatient procedure, which means that you went home the same day as your procedure, a responsible adult should be with you for the first 24 hours after you arrive home. °· Keep all follow-up visits as directed by your health care provider. This is important. °SEEK MEDICAL CARE IF: °· You have a fever. °· You have chills. °· You have increased bleeding from the radial site. Hold pressure on the site. °SEEK IMMEDIATE MEDICAL CARE IF: °· You have unusual pain at the radial site. °· You have redness, warmth, or swelling at the radial site. °· You have drainage (other than a small amount of blood on the dressing) from the radial site. °· The radial site is bleeding, and the bleeding does not stop after 30 minutes of holding steady pressure on the site. °· Your arm or hand becomes pale, cool, tingly, or numb. °  °This information is not intended to replace advice given to you by your health care provider. Make sure you discuss any questions you have with your health care provider. °  °Document Released: 11/16/2010 Document Revised: 11/04/2014 Document Reviewed: 05/02/2014 °Elsevier Interactive Patient Education ©2016 Elsevier Inc. ° °

## 2016-05-20 ENCOUNTER — Telehealth: Payer: Self-pay | Admitting: Cardiology

## 2016-05-20 NOTE — Telephone Encounter (Signed)
No further eval No cad on cath [facility]

## 2016-05-20 NOTE — Telephone Encounter (Signed)
Returned call to [employer] at [facility]-Dr. Mosetta Putt.  Requesting EKG and recent lab work be faxed to them for upcoming surgical procedure- EKG 7/20 and lab work from 7/18 faxed to [phone].

## 2016-05-20 NOTE — Telephone Encounter (Signed)
New message        Pt had a cath last thurs.  There was no blockage.  Pt want to know what is the next step?

## 2016-05-20 NOTE — Telephone Encounter (Signed)
New message    Office calling wants to discuss patient upcoming surgery.    Patient stating lab work was done recent .   Request for surgical clearance:  1. What type of surgery is being performed? At [facility] - shunt  2. When is this surgery scheduled? 8.16.2017   3. Are there any medications that need to be held prior to surgery and how long? No   4. Name of physician performing surgery? Office unsure of Surgeon.    5. What is your office phone and fax number? [phone] /  fax [phone]

## 2016-05-20 NOTE — Telephone Encounter (Signed)
Returned call to patient.  Patient had normal LHC last week Patient wants to know what the next step is  Advised patient to continue on current medications as advised  Patient had EKG/echo prior to appointment last week - EKG was concerning to MD per patient. He wants to know if him having a normal LHC was satisfying to MD  Informed him MD is OOO but will route message to him for advice

## 2016-05-21 ENCOUNTER — Telehealth: Payer: Self-pay | Admitting: Cardiology

## 2016-05-21 ENCOUNTER — Ambulatory Visit
Admission: RE | Admit: 2016-05-21 | Discharge: 2016-05-21 | Disposition: A | Discharge status: Home / Self Care | Payer: Medicare Other | Source: Ambulatory Visit | Attending: Family Medicine | Admitting: Family Medicine

## 2016-05-21 ENCOUNTER — Encounter: Payer: Self-pay | Admitting: *Deleted

## 2016-05-21 ENCOUNTER — Other Ambulatory Visit: Payer: Self-pay | Admitting: Family Medicine

## 2016-05-21 DIAGNOSIS — Z01811 Encounter for preprocedural respiratory examination: Secondary | ICD-10-CM

## 2016-05-21 NOTE — Telephone Encounter (Signed)
Left message for pt of dr Jens Som recommendations. He is to call with questions or concerns.

## 2016-05-21 NOTE — Telephone Encounter (Signed)
This encounter was created in error - please disregard.

## 2016-05-21 NOTE — Telephone Encounter (Signed)
Patient called back-requesting you call back after 2p

## 2016-05-21 NOTE — Telephone Encounter (Signed)
Spoke with pt, medication changes made.

## 2016-05-21 NOTE — Telephone Encounter (Signed)
New message   Patient calling from the last office visit patient notice 2 mistakes on medication would like to discuss and get change.

## 2016-05-21 NOTE — Telephone Encounter (Signed)
Left message for pt to call.

## 2016-05-22 ENCOUNTER — Other Ambulatory Visit: Payer: Self-pay | Admitting: Family Medicine

## 2016-05-22 ENCOUNTER — Telehealth: Payer: Self-pay | Admitting: *Deleted

## 2016-05-22 DIAGNOSIS — R918 Other nonspecific abnormal finding of lung field: Secondary | ICD-10-CM

## 2016-05-22 NOTE — Telephone Encounter (Signed)
Cath, labs, nuclear stress test, echo and office note faxed to the number provided

## 2016-05-23 ENCOUNTER — Telehealth: Payer: Self-pay | Admitting: Neurology

## 2016-05-23 NOTE — Telephone Encounter (Signed)
Patient called requesting to speak with Medical Records

## 2016-05-24 ENCOUNTER — Ambulatory Visit
Admission: RE | Admit: 2016-05-24 | Discharge: 2016-05-24 | Disposition: A | Discharge status: Home / Self Care | Payer: Medicare Other | Source: Ambulatory Visit | Attending: Family Medicine | Admitting: Family Medicine

## 2016-05-24 DIAGNOSIS — R918 Other nonspecific abnormal finding of lung field: Secondary | ICD-10-CM

## 2016-05-28 NOTE — Telephone Encounter (Signed)
I called the patient. The patient has seen Dr. Newell Coral. 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 If the surgery is done elsewhere, they would not be involved with follow-up care. The patient may wish to wait 6-8 months the see how to walking progresses. He is considering canceling the surgery that is scheduled 2 weeks from now.

## 2016-05-28 NOTE — Telephone Encounter (Signed)
Patient is calling and would like to discuss his visit with Dr. Jule Ser. Thanks!

## 2016-06-11 ENCOUNTER — Telehealth: Payer: Self-pay | Admitting: Neurology

## 2016-06-11 DIAGNOSIS — G912 (Idiopathic) normal pressure hydrocephalus: Secondary | ICD-10-CM

## 2016-06-11 NOTE — Telephone Encounter (Signed)
Pt requesting referral from Dr W to be sent neurosurgeon Dr Madaline [facility]illi[facility]odd at [facility].

## 2016-06-11 NOTE — Telephone Encounter (Signed)
I will make the referral.

## 2016-06-14 NOTE — Telephone Encounter (Signed)
FYI-Patient is calling to thank you for the referral to [facility] and states he is now at [facility] and has had the surgery. A returned call is not needed.

## 2016-06-24 ENCOUNTER — Other Ambulatory Visit: Payer: Self-pay | Admitting: Neurosurgery

## 2016-06-24 ENCOUNTER — Ambulatory Visit
Admission: RE | Admit: 2016-06-24 | Discharge: 2016-06-24 | Disposition: A | Discharge status: Home / Self Care | Payer: Medicare Other | Source: Ambulatory Visit | Attending: Neurosurgery | Admitting: Neurosurgery

## 2016-06-24 DIAGNOSIS — Z09 Encounter for follow-up examination after completed treatment for conditions other than malignant neoplasm: Secondary | ICD-10-CM

## 2016-07-10 ENCOUNTER — Telehealth: Payer: Self-pay | Admitting: Neurology

## 2016-07-10 NOTE — Telephone Encounter (Signed)
Pt had a [facility] shot at [facility] and he would like to know what he needs to do next. Please call [phone]

## 2016-07-10 NOTE — Telephone Encounter (Signed)
I called patient, left a message, I will call back later. 

## 2016-07-10 NOTE — Telephone Encounter (Signed)
Pt had VP shunt at [facility]. He has a follow-up appt scheduled w/ Dr. Anne HahnWillis next month.

## 2016-07-10 NOTE — Telephone Encounter (Signed)
I called patient. The patient has had the VP shunt placed, he is doing much better, indicates that his ability to ambulate has returned back to normal. He will be [facility] through [facility] for the programmable shunt. I will have the revisit here canceled, he can follow-up if needed.

## 2016-07-19 ENCOUNTER — Emergency Department (HOSPITAL_COMMUNITY): Payer: Medicare Other

## 2016-07-19 ENCOUNTER — Emergency Department (HOSPITAL_COMMUNITY)
Admission: EM | Admit: 2016-07-19 | Discharge: 2016-07-19 | Disposition: A | Discharge status: Home / Self Care | Payer: Medicare Other | Attending: Emergency Medicine | Admitting: Emergency Medicine

## 2016-07-19 ENCOUNTER — Encounter (HOSPITAL_COMMUNITY): Payer: Self-pay

## 2016-07-19 DIAGNOSIS — Z87891 Personal history of nicotine dependence: Secondary | ICD-10-CM | POA: Insufficient documentation

## 2016-07-19 DIAGNOSIS — F909 Attention-deficit hyperactivity disorder, unspecified type: Secondary | ICD-10-CM | POA: Insufficient documentation

## 2016-07-19 DIAGNOSIS — L03119 Cellulitis of unspecified part of limb: Secondary | ICD-10-CM | POA: Diagnosis not present

## 2016-07-19 DIAGNOSIS — G43909 Migraine, unspecified, not intractable, without status migrainosus: Secondary | ICD-10-CM | POA: Insufficient documentation

## 2016-07-19 DIAGNOSIS — Z79899 Other long term (current) drug therapy: Secondary | ICD-10-CM | POA: Insufficient documentation

## 2016-07-19 DIAGNOSIS — I10 Essential (primary) hypertension: Secondary | ICD-10-CM | POA: Insufficient documentation

## 2016-07-19 DIAGNOSIS — L0390 Cellulitis, unspecified: Secondary | ICD-10-CM

## 2016-07-19 DIAGNOSIS — Z7982 Long term (current) use of aspirin: Secondary | ICD-10-CM | POA: Insufficient documentation

## 2016-07-19 DIAGNOSIS — T85618A Breakdown (mechanical) of other specified internal prosthetic devices, implants and grafts, initial encounter: Secondary | ICD-10-CM

## 2016-07-19 DIAGNOSIS — Z5189 Encounter for other specified aftercare: Secondary | ICD-10-CM

## 2016-07-19 LAB — BASIC METABOLIC PANEL
Anion gap: 8 (ref 5–15)
BUN: 11 mg/dL (ref 6–20)
CHLORIDE: 101 mmol/L (ref 101–111)
CO2: 26 mmol/L (ref 22–32)
Calcium: 9.1 mg/dL (ref 8.9–10.3)
Creatinine, Ser: 1.03 mg/dL (ref 0.61–1.24)
GFR calc non Af Amer: >60 mL/min (ref >60)
GLUCOSE: 118 mg/dL — AB (ref 65–99)
POTASSIUM: 4.5 mmol/L (ref 3.5–5.1)
Sodium: 135 mmol/L (ref 135–145)

## 2016-07-19 LAB — I-STAT CG4 LACTIC ACID, ED: Lactic Acid, Venous: 1.66 mmol/L (ref 0.5–1.9)

## 2016-07-19 LAB — CBC WITH DIFFERENTIAL/PLATELET
BASOS ABS: 0 10*3/uL (ref 0.0–0.1)
BASOS PCT: 0 %
Eosinophils Absolute: 0.2 10*3/uL (ref 0.0–0.7)
Eosinophils Relative: 3 %
HEMATOCRIT: 44.7 % (ref 39.0–52.0)
HEMOGLOBIN: 15.1 g/dL (ref 13.0–17.0)
LYMPHS PCT: 17 %
Lymphs Abs: 1.4 10*3/uL (ref 0.7–4.0)
MCH: 32.1 pg (ref 26.0–34.0)
MCHC: 33.8 g/dL (ref 30.0–36.0)
MCV: 94.9 fL (ref 78.0–100.0)
MONO ABS: 0.7 10*3/uL (ref 0.1–1.0)
Monocytes Relative: 9 %
NEUTROS ABS: 5.6 10*3/uL (ref 1.7–7.7)
NEUTROS PCT: 71 %
Platelets: 131 10*3/uL — ABNORMAL LOW (ref 150–400)
RBC: 4.71 MIL/uL (ref 4.22–5.81)
RDW: 13.2 % (ref 11.5–15.5)
WBC: 7.9 10*3/uL (ref 4.0–10.5)

## 2016-07-19 MED ORDER — DOXYCYCLINE HYCLATE 100 MG PO CAPS: 100.0000 mg | ORAL_CAPSULE | Freq: Two times a day (BID) | ORAL | 0 refills | Status: DC

## 2016-07-19 NOTE — ED Triage Notes (Signed)
Pt states he had brain surgery "to replace a VP shunt" on August 16th at [facility]. He has noticed bleeding at the incision site and told the neurosurgeon and was told to come to ER for possible meningitis. A&OX4.

## 2016-07-19 NOTE — ED Notes (Signed)
Pt up to restroom.  Gait steady and even.  Patient walks with cane, NAD

## 2016-07-19 NOTE — Consult Note (Signed)
Reason for Consult: Bloody drainage from incision site Referring Physician: ER  Clayton Burke is an 75 y.o. male.  HPI: 75 year old woman 1 month out from VP shunt placement no skull drops of blood over the last week sustaining a towel without active drainage from his cranial incision from his ventricular catheter placement no fevers no chills no other signs of meningismus and no active drainage.  Past Medical History:  Diagnosis Date  . Abnormality of gait 05/12/2013  . ADD (attention deficit disorder)   . Bipolar 1 disorder ([place])   . BPH (benign prostatic hyperplasia)   . 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 DDD (degenerative disc disease), cervical   . Depression   . Diverticulosis   . Gout   . HTN (hypertension)   . Hydrocephalus, communicating   . Hyperlipidemia   . LBBB (left bundle branch block)   . Migraine   . PVC (premature ventricular contraction)   . Radiculopathy    lumbar with right leg pain  . 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 Zoster     Past Surgical History:  Procedure Laterality Date  . BELPHAROPTOSIS REPAIR    . CARDIAC CATHETERIZATION N/A 05/16/2016   Procedure: Left Heart Cath and Coronary Angiography;  Surgeon: Jettie Booze, MD;  Location: [facility];  Service: Cardiovascular;  Laterality: N/A;  . CATARACT EXTRACTION    . TONSILLECTOMY AND ADENOIDECTOMY      Family History  Problem Relation Age of Onset  . Hypertension Father   . Stroke Father   . Uterine cancer Mother   . Parkinsonism Maternal Uncle   . Bipolar disorder Brother     Social History:  reports that he quit smoking about 39 years ago. His smoking use included Cigarettes. He has a 24.00 pack-year smoking history. He has never used smokeless tobacco. He reports that he drinks about 8.4 oz of alcohol per week . He reports that he does not use drugs.  Allergies: No Known Allergies  Medications: I have reviewed the patient's current medications.  Results for  orders placed or performed during the hospital encounter of 07/19/16 (from the past 48 hour(s))  CBC with Differential     Status: Abnormal   Collection Time: 07/19/16  2:01 PM  Result Value Ref Range   WBC 7.9 4.0 - 10.5 K/uL   RBC 4.71 4.22 - 5.81 MIL/uL   Hemoglobin 15.1 13.0 - 17.0 g/dL   HCT 44.7 39.0 - 52.0 %   MCV 94.9 78.0 - 100.0 fL   MCH 32.1 26.0 - 34.0 pg   MCHC 33.8 30.0 - 36.0 g/dL   RDW 13.2 11.5 - 15.5 %   Platelets 131 (L) 150 - 400 K/uL   Neutrophils Relative % 71 %   Neutro Abs 5.6 1.7 - 7.7 K/uL   Lymphocytes Relative 17 %   Lymphs Abs 1.4 0.7 - 4.0 K/uL   Monocytes Relative 9 %   Monocytes Absolute 0.7 0.1 - 1.0 K/uL   Eosinophils Relative 3 %   Eosinophils Absolute 0.2 0.0 - 0.7 K/uL   Basophils Relative 0 %   Basophils Absolute 0.0 0.0 - 0.1 K/uL  Basic metabolic panel     Status: Abnormal   Collection Time: 07/19/16  2:01 PM  Result Value Ref Range   Sodium 135 135 - 145 mmol/L   Potassium 4.5 3.5 - 5.1 mmol/L   Chloride 101 101 - 111 mmol/L   CO2 26 22 - 32 mmol/L   Glucose, Bld  118 (H) 65 - 99 mg/dL   BUN 11 6 - 20 mg/dL   Creatinine, Ser 1.03 0.61 - 1.24 mg/dL   Calcium 9.1 8.9 - 10.3 mg/dL   GFR calc non Af Amer >60 >60 mL/min   GFR calc Af Amer >60 >60 mL/min    Comment: (NOTE) The eGFR has been calculated using the CKD EPI equation. This calculation has not been validated in all clinical situations. eGFR's persistently <60 mL/min signify possible Chronic Kidney Disease.    Anion gap 8 5 - 15  I-Stat CG4 Lactic Acid, ED     Status: None   Collection Time: 07/19/16  2:12 PM  Result Value Ref Range   Lactic Acid, Venous 1.66 0.5 - 1.9 mmol/L    Dg Skull 1-3 Views  Result Date: 07/19/2016 CLINICAL DATA:  Shunt catheter placement with concern for meningitis EXAM: SKULL - 1-3 VIEW COMPARISON:  Head CT June 24, 2016 FINDINGS: Frontal and lateral views were obtained. Shunt catheter tip is slightly to the left of midline, unchanged. There is  lucency at the site of the shunt connection at the level of the scalp on the right, unchanged from CT. Shunt tubing appears intact. No fracture. Sella appears normal. IMPRESSION: No change from recent CT. Shunt tubing appears intact. Tip of shunt catheter is slightly to the left of midline in the expected location of the left lateral ventricle. Electronically Signed   By: Lowella Grip III M.D.   On: 07/19/2016 14:01   Dg Chest 1 View  Result Date: 07/19/2016 CLINICAL DATA:  VP shunt, bleeding at incision site EXAM: CHEST 1 VIEW COMPARISON:  05/21/2016 FINDINGS: Cardiomediastinal silhouette is stable. No acute infiltrate or pleural effusion. No pulmonary edema. Right chest wall VP shunt catheter is noted. No definite evidence of catheter interruption. IMPRESSION: No active disease.  Right VP shunt chest wall catheter. Electronically Signed   By: Lahoma Crocker M.D.   On: 07/19/2016 13:58   Dg Cervical Spine 1 View  Result Date: 07/19/2016 CLINICAL DATA:  Ventriculoperitoneal shunt with concern for meningitis EXAM: CERVICAL SPINE 1 VIEW COMPARISON:  None. FINDINGS: Frontal cervical spine image shows a shunt catheter on the right with tubing appearing patent in the visualized regions. There is osteoarthritic change in the lower cervical spine. No bony destruction. No fracture or spondylolisthesis evident on frontal view. IMPRESSION: Shunt tubing on the right appears intact. Electronically Signed   By: Lowella Grip III M.D.   On: 07/19/2016 13:58   Dg Abdomen 1 View  Result Date: 07/19/2016 CLINICAL DATA:  Shunt catheter placement EXAM: ABDOMEN - 1 VIEW COMPARISON:  None. FINDINGS: Shunt catheter tip is in the right pelvis. Shunt tubing appears intact. Bowel gas pattern is normal. Moderate stool in colon. No bowel obstruction or free air. IMPRESSION: Bowel gas pattern normal. Shunt tubing intact. Shunt catheter tip in right pelvis. Electronically Signed   By: Lowella Grip III M.D.   On: 07/19/2016  13:58   Ct Head Wo Contrast  Result Date: 07/19/2016 CLINICAL DATA:  Recent shunt catheter placement for hydrocephalus. Bleeding along the right scalp area EXAM: CT HEAD WITHOUT CONTRAST TECHNIQUE: Contiguous axial images were obtained from the base of the skull through the vertex without intravenous contrast. COMPARISON:  June 24, 2016 FINDINGS: Brain: There is a shunt catheter with the tip in the anterior horn of the left lateral ventricle. The visualized shunt tubing appears patent. The ventricular enlargement remains stable. There is mild sulcal atrophy which is stable. There  is no apparent intracranial mass, hemorrhage, extra-axial fluid collection, or midline shift. There is slight decreased attenuation in the periventricular white matter consistent with either mild small vessel disease or interstitial edema from hydrocephalus. No acute infarct is evident. Vascular: There is no hyperdense vessel. There is calcification in each distal vertebral artery. There are scattered foci of calcification in each carotid siphon. Skull: There is a craniotomy defect in the right frontal bone. Other bony structures appear intact. Sinuses/Orbits: Visualized paranasal sinuses are clear. No intraorbital lesions are evident. Other: Mastoid air cells are clear. IMPRESSION: Stable shunt catheter placement. Stable ventricular enlargement with mild underlying sulcal atrophy. Mild decreased attenuation in the periventricular white matter consistent with either stable small vessel disease or stable mild interstitial edema from hydrocephalus. No new gray-white compartment lesion. No acute infarct. There are scattered foci of vascular calcification. Electronically Signed   By: Lowella Grip III M.D.   On: 07/19/2016 15:37    Review of Systems  Constitutional: Negative.   HENT: Negative.   Eyes: Negative.   Respiratory: Negative.   Cardiovascular: Negative.   Gastrointestinal: Negative.   Genitourinary: Negative.    Musculoskeletal: Negative.   Skin: Negative.   Neurological: Negative.   Endo/Heme/Allergies: Negative.   Psychiatric/Behavioral: Negative.    Blood pressure 166/84, pulse 65, temperature 98 F (36.7 C), temperature source Oral, resp. rate 18, SpO2 97 %. Physical Exam  Neurological: He has normal strength. GCS eye subscore is 4. GCS verbal subscore is 5. GCS motor subscore is 6.  Awake alert oriented pupils equal neurologically intact small area of scab over the incision for the ventricular catheter    Assessment/Plan: What looks like small superficial infection around the scalp incision at the site of his ventricular catheter placement small scab the remaining incision was goodcellulitis no active drainage. Recommend patient go home on doxycycline 100 mg twice a day and drainage should stop by the time the antibiotics are done and to notify us if not and or follow-up with his neurosurgeon he has been referred to at [facility]  Tuck Dulworth P 07/19/2016, 4:32 PM

## 2016-07-19 NOTE — ED Notes (Signed)
MD at bedside updating pt

## 2016-07-19 NOTE — ED Notes (Signed)
Pt taken to xray and CT 

## 2016-07-19 NOTE — ED Provider Notes (Addendum)
[facility] Provider Note   CSN: [id number] Arrival date & time: 07/19/16  1141     History   Chief Complaint Chief Complaint  Patient presents with  . Post-op Problem    HPI Clayton Burke is a 75 y.o. male.  HPI 75 year old male Who presents with concern for postop complication. 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. States that he has been feeling improved since his surgery. Was in the process of [facility] under Dr. Angelena SoleSamson. He took off his dressing this morning off from his scalp, and noted some mild bleeding. Sent picture of wound to his surgeon's office and to his PCP's office. He was called by the NP form Dr. Theodoro KosLuciano's office and told to go straight to nearest ED for r/o of meningitis.   He denies fever, chills, nausea or vomiting, photophobia, neck stiffness, confusion, headache, focal numbness/weakness. Denies cp, sob, abd pain, urinary complaints or GI complaints.  Past Medical History:  Diagnosis Date  . Abnormality of gait 05/12/2013  . ADD (attention deficit disorder)   . Bipolar 1 disorder ([facility])   . BPH (benign prostatic hyperplasia)   . 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 DDD (degenerative disc disease), cervical   . Depression   . Diverticulosis   . Gout   . HTN (hypertension)   . Hydrocephalus, communicating   . Hyperlipidemia   . LBBB (left bundle branch block)   . Migraine   . PVC (premature ventricular contraction)   . Radiculopathy    lumbar with right leg pain  . 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 Zoster     Patient Active Problem List   Diagnosis Date Noted  . Abnormal nuclear stress test   . Pre-op testing   . 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 Abnormality of gait 05/12/2013  . LBBB (left bundle branch block) 03/18/2013  . PVC (premature ventricular  contraction) 03/18/2013    Past Surgical History:  Procedure Laterality Date  . BELPHAROPTOSIS REPAIR    . CARDIAC CATHETERIZATION N/A 05/16/2016   Procedure: Left Heart Cath and Coronary Angiography;  Surgeon: Corky CraftsJayadeep S Varanasi, MD;  [facility];  Service: Cardiovascular;  Laterality: N/A;  . CATARACT EXTRACTION    . TONSILLECTOMY AND ADENOIDECTOMY         Home Medications    Prior to Admission medications   Medication Sig Start Date End Date Taking? Authorizing Provider  ALPRAZolam Prudy Feeler(XANAX) 0.5 MG tablet Take 2 tablets (1 mg total) by mouth at bedtime. 05/21/16  Yes Lewayne BuntingBrian S Crenshaw, MD  aspirin 81 MG tablet Take 81 mg by mouth at bedtime.    Yes Historical Provider, MD  buPROPion (BUDEPRION XL) 300 MG 24 hr tablet Take 300 mg by mouth daily.  06/04/14  Yes Historical Provider, MD  finasteride (PROSCAR) 5 MG tablet Take 5 mg by mouth daily. 11/06/13  Yes Historical Provider, MD  FLUoxetine (PROZAC) 40 MG capsule Take 40 mg by mouth daily.   Yes Historical Provider, MD  HYDROcodone-acetaminophen (NORCO/VICODIN) 5-325 MG per tablet Take 1 tablet by mouth every 6 (six) hours as needed for moderate pain.    Yes Historical Provider, MD  lamoTRIgine (LAMICTAL) 100 MG tablet Take 100 mg by mouth 2 (two) times daily.  09/06/13  Yes Historical Provider, MD  losartan (COZAAR) 100 MG tablet Take 100 mg  by mouth daily. 11/06/13  Yes Historical Provider, MD  lovastatin (MEVACOR) 20 MG tablet Take 20 mg by mouth daily at 6 PM.  11/06/13  Yes Historical Provider, MD  LYRICA 100 MG capsule Take 100 mg by mouth daily. 05/01/16  Yes Historical Provider, MD  propranolol (INDERAL) 10 MG tablet Take 10 mg by mouth daily.    Yes Historical Provider, MD  QUEtiapine (SEROQUEL) 25 MG tablet Take 25 mg by mouth at bedtime.   Yes Historical Provider, MD  tamsulosin (FLOMAX) 0.4 MG CAPS Take 0.8 mg by mouth daily after supper.    Yes Historical Provider, MD  doxycycline (VIBRAMYCIN) 100 MG capsule  Take 1 capsule (100 mg total) by mouth 2 (two) times daily. 07/19/16   Lavera Guise, MD    Family History Family History  Problem Relation Age of Onset  . Hypertension Father   . Stroke Father   . Uterine cancer Mother   . Parkinsonism Maternal Uncle   . Bipolar disorder Brother     Social History Social History  Substance Use Topics  . Smoking status: Former Smoker    Packs/day: 2.00    Years: 12.00    Types: Cigarettes    Quit date: 07/24/1977  . Smokeless tobacco: Never Used  . Alcohol use 8.4 oz/week    14 Glasses of wine per week     Comment: 2 glasses wine per night     Allergies   Review of patient's allergies indicates no known allergies.   Review of Systems Review of Systems 10/14 systems reviewed and are negative other than those stated in the HPI   Physical Exam Updated Vital Signs BP 166/84   Pulse 65   Temp 98 F (36.7 C) (Oral)   Resp 18   SpO2 97%   Physical Exam Physical Exam  Nursing note and vitals reviewed. Constitutional: Well developed, well nourished, non-toxic, and in no acute distress Head: Normocephalic and atraumatic.  Mouth/Throat: Oropharynx is clear and moist.  Neck: Normal range of motion. Neck supple. No meningismus. Cardiovascular: Normal rate and regular rhythm.   Pulmonary/Chest: Effort normal and breath sounds normal.  Abdominal: Soft. There is no tenderness. There is no rebound and no guarding.  Musculoskeletal: Normal range of motion.  Skin: Skin is warm and dry. Surgical wound over right scalp clean dry and in tact. At one wound edge, with dried blood and mild surrounding erythema and dry skin. Psychiatric: Cooperative Neurological:  Alert, oriented to person, place, time, and situation. Memory grossly in tact. Fluent speech. No dysarthria or aphasia.  Cranial nerves: VF are full.  Pupils are symmetric, and reactive to light. EOMI without nystagmus. No gaze deviation. Facial muscles symmetric with activation. Sensation  to light touch over face in tact bilaterally. Hearing grossly in tact. Palate elevates symmetrically. Head turn and shoulder shrug are intact. Tongue midline.  Reflexes defered.  Muscle bulk and tone normal. No pronator drift. Moves all extremities symmetrically. Sensation to light touch is in tact throughout in bilateral upper and lower extremities. Coordination reveals no dysmetria with finger to nose.     ED Treatments / Results  Labs (all labs ordered are listed, but only abnormal results are displayed) Labs Reviewed  CBC WITH DIFFERENTIAL/PLATELET - Abnormal; Notable for the following:       Result Value   Platelets 131 (*)    All other components within normal limits  BASIC METABOLIC PANEL - Abnormal; Notable for the following:    Glucose, Bld 118 (*)  All other components within normal limits  I-STAT CG4 LACTIC ACID, ED    EKG  EKG Interpretation None       Radiology Dg Skull 1-3 Views  Result Date: 07/19/2016 CLINICAL DATA:  Shunt catheter placement with concern for meningitis EXAM: SKULL - 1-3 VIEW COMPARISON:  Head CT June 24, 2016 FINDINGS: Frontal and lateral views were obtained. Shunt catheter tip is slightly to the left of midline, unchanged. There is lucency at the site of the shunt connection at the level of the scalp on the right, unchanged from CT. Shunt tubing appears intact. No fracture. Sella appears normal. IMPRESSION: No change from recent CT. Shunt tubing appears intact. Tip of shunt catheter is slightly to the left of midline in the expected location of the left lateral ventricle. Electronically Signed   By: Bretta Bang III M.D.   On: 07/19/2016 14:01   Dg Chest 1 View  Result Date: 07/19/2016 CLINICAL DATA:  VP shunt, bleeding at incision site EXAM: CHEST 1 VIEW COMPARISON:  05/21/2016 FINDINGS: Cardiomediastinal silhouette is stable. No acute infiltrate or pleural effusion. No pulmonary edema. Right chest wall VP shunt catheter is noted. No  definite evidence of catheter interruption. IMPRESSION: No active disease.  Right VP shunt chest wall catheter. Electronically Signed   By: Natasha Mead M.D.   On: 07/19/2016 13:58   Dg Cervical Spine 1 View  Result Date: 07/19/2016 CLINICAL DATA:  Ventriculoperitoneal shunt with concern for meningitis EXAM: CERVICAL SPINE 1 VIEW COMPARISON:  None. FINDINGS: Frontal cervical spine image shows a shunt catheter on the right with tubing appearing patent in the visualized regions. There is osteoarthritic change in the lower cervical spine. No bony destruction. No fracture or spondylolisthesis evident on frontal view. IMPRESSION: Shunt tubing on the right appears intact. Electronically Signed   By: Bretta Bang III M.D.   On: 07/19/2016 13:58   Dg Abdomen 1 View  Result Date: 07/19/2016 CLINICAL DATA:  Shunt catheter placement EXAM: ABDOMEN - 1 VIEW COMPARISON:  None. FINDINGS: Shunt catheter tip is in the right pelvis. Shunt tubing appears intact. Bowel gas pattern is normal. Moderate stool in colon. No bowel obstruction or free air. IMPRESSION: Bowel gas pattern normal. Shunt tubing intact. Shunt catheter tip in right pelvis. Electronically Signed   By: Bretta Bang III M.D.   On: 07/19/2016 13:58   Ct Head Wo Contrast  Result Date: 07/19/2016 CLINICAL DATA:  Recent shunt catheter placement for hydrocephalus. Bleeding along the right scalp area EXAM: CT HEAD WITHOUT CONTRAST TECHNIQUE: Contiguous axial images were obtained from the base of the skull through the vertex without intravenous contrast. COMPARISON:  June 24, 2016 FINDINGS: Brain: There is a shunt catheter with the tip in the anterior horn of the left lateral ventricle. The visualized shunt tubing appears patent. The ventricular enlargement remains stable. There is mild sulcal atrophy which is stable. There is no apparent intracranial mass, hemorrhage, extra-axial fluid collection, or midline shift. There is slight decreased attenuation  in the periventricular white matter consistent with either mild small vessel disease or interstitial edema from hydrocephalus. No acute infarct is evident. Vascular: There is no hyperdense vessel. There is calcification in each distal vertebral artery. There are scattered foci of calcification in each carotid siphon. Skull: There is a craniotomy defect in the right frontal bone. Other bony structures appear intact. Sinuses/Orbits: Visualized paranasal sinuses are clear. No intraorbital lesions are evident. Other: Mastoid air cells are clear. IMPRESSION: Stable shunt catheter placement. Stable ventricular enlargement  with mild underlying sulcal atrophy. Mild decreased attenuation in the periventricular white matter consistent with either stable small vessel disease or stable mild interstitial edema from hydrocephalus. No new gray-white compartment lesion. No acute infarct. There are scattered foci of vascular calcification. Electronically Signed   By: Bretta Bang III M.D.   On: 07/19/2016 15:37    Procedures Procedures (including critical care time)  Medications Ordered in ED Medications - No data to display   Initial Impression / Assessment and Plan / ED Course  I have reviewed the triage vital signs and the nursing notes.  Pertinent labs & imaging results that were available during my care of the patient were reviewed by me and considered in my medical decision making (see chart for details).  Clinical Course   75 year old male who presents for wound check and concern for meningitis. He is well-appearing in no acute distress. He is afebrile with normal vital signs. He has an intact neurological exam. He has no meningismus, headache, nausea or vomiting, photophobia or any symptoms that would be concerning for meningitis. His postsurgical wound over the right side of his scalp is clean dry and intact. Over one wound edge there is dried blood, but no purulent drainage. Only mild erythema is  noted around this without significant tenderness or swelling. His shunt series is obtained and overall unremarkable. CT head shows no acute cardiopulmonary processes. Normal lactate, no leukocytosis, no major electrolyte or metabolic derangements. My clinical suspicion for acute meningitis or complications from his VP shunt is very low. I attempted to get a hold of Dr. Theodoro Kos office through [facility] multiple times, but was unable to reach anybody. I spoke with Dr. Wynetta Emery from [facility] neurosurgery. He came down to evaluate the patient. He did not feel that clinically patient had symptoms concerning for meningitis, and his wound was not concerning for wound infection extending into a shunt. He did not feel the patient required tap to [place] out meningitis at this time. He did recommend a course of doxycycline for questionable superficial infection regarding his stable wound. Patient will have close outpatient follow-up with his neurosurgeon through [facility].   I attempted to also speak with [facility] neurosurgery on-call, and without being able to actually see wound and patient, agreed with plan of care.   Strict return and follow-up instructions reviewed. He expressed understanding of all discharge instructions and felt comfortable with the plan of care.    Final Clinical Impressions(s) / ED Diagnoses   Final diagnoses:  Visit for wound check  Cellulitis, unspecified cellulitis site, unspecified extremity site, unspecified laterality    New Prescriptions New Prescriptions   DOXYCYCLINE (VIBRAMYCIN) 100 MG CAPSULE    Take 1 capsule (100 mg total) by mouth 2 (two) times daily.     Lavera Guise, MD 07/19/16 1659    Lavera Guise, MD 07/19/16 [phone]

## 2016-07-19 NOTE — ED Notes (Signed)
Pt denies fevers or stiff neck, states he occassionally has headaches. Denies headache at this time.

## 2016-07-19 NOTE — Discharge Instructions (Signed)
Our  neurosurgeon Dr. Wynetta Emeryram did not think you needed to have tap for meningitis. He does not think your shunt was exposed to infection. There is question of maybe a small bit of skin infection around the staple site and he recommended a course of antibiotics.   Return without fail for any worsening symptoms, including fever, headache, difficulty moving her neck to the pain, sensitivity to light, nausea or vomiting, pus drainage or increased redness/swelling around her surgical wound, or any other symptoms concerning to you.  Please also follow up closely with [facility] for close wound recheck.

## 2016-08-09 ENCOUNTER — Ambulatory Visit: Payer: Medicare Other | Admitting: Neurology

## 2017-02-28 ENCOUNTER — Other Ambulatory Visit: Payer: Self-pay | Admitting: Family Medicine

## 2017-02-28 ENCOUNTER — Ambulatory Visit
Admission: RE | Admit: 2017-02-28 | Discharge: 2017-02-28 | Disposition: A | Discharge status: Home / Self Care | Payer: Medicare Other | Source: Ambulatory Visit | Attending: Family Medicine | Admitting: Family Medicine

## 2017-02-28 ENCOUNTER — Other Ambulatory Visit (HOSPITAL_BASED_OUTPATIENT_CLINIC_OR_DEPARTMENT_OTHER): Payer: Self-pay

## 2017-02-28 DIAGNOSIS — M79606 Pain in leg, unspecified: Secondary | ICD-10-CM

## 2017-02-28 DIAGNOSIS — M5416 Radiculopathy, lumbar region: Secondary | ICD-10-CM

## 2017-02-28 DIAGNOSIS — G4730 Sleep apnea, unspecified: Secondary | ICD-10-CM

## 2017-02-28 DIAGNOSIS — R29898 Other symptoms and signs involving the musculoskeletal system: Secondary | ICD-10-CM

## 2017-02-28 DIAGNOSIS — R400 Somnolence: Secondary | ICD-10-CM

## 2017-02-28 DIAGNOSIS — M549 Dorsalgia, unspecified: Secondary | ICD-10-CM

## 2017-03-04 ENCOUNTER — Other Ambulatory Visit: Payer: Self-pay | Admitting: Family Medicine

## 2017-03-04 DIAGNOSIS — M545 Low back pain, unspecified: Secondary | ICD-10-CM

## 2017-03-04 DIAGNOSIS — R29898 Other symptoms and signs involving the musculoskeletal system: Secondary | ICD-10-CM

## 2017-03-18 ENCOUNTER — Ambulatory Visit
Admission: RE | Admit: 2017-03-18 | Discharge: 2017-03-18 | Disposition: A | Discharge status: Home / Self Care | Payer: Medicare Other | Source: Ambulatory Visit | Attending: Family Medicine | Admitting: Family Medicine

## 2017-03-18 DIAGNOSIS — M545 Low back pain, unspecified: Secondary | ICD-10-CM

## 2017-03-18 DIAGNOSIS — R29898 Other symptoms and signs involving the musculoskeletal system: Secondary | ICD-10-CM

## 2017-04-19 IMAGING — CT CT CHEST W/O CM
1 of 2 series · 14 of 32 positions shown, 18 images · non-contrast
Comparison: Plain film from 05/21/2016

CLINICAL DATA: Possible nodular density on recent chest x-ray,
initial encounter

EXAM:
CT CHEST WITHOUT CONTRAST
TECHNIQUE: Multidetector CT imaging of the chest was performed following the
standard protocol without IV contrast.

[Series 2: chest w/(date) · axial · 0.75mm/px · z∈[-271,-25]mm · 14 of 147 slices shown, 18 images]
[im 12/147  mediastinal]
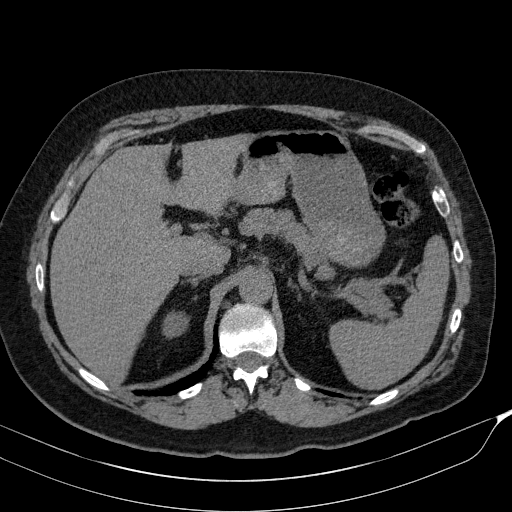
[im 12/147  lung]
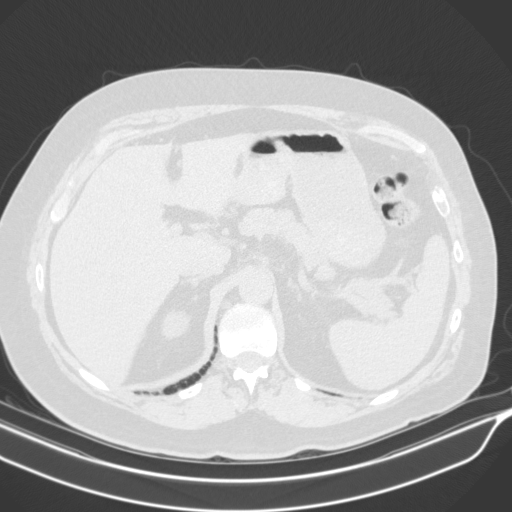
[im 23/147  lung]
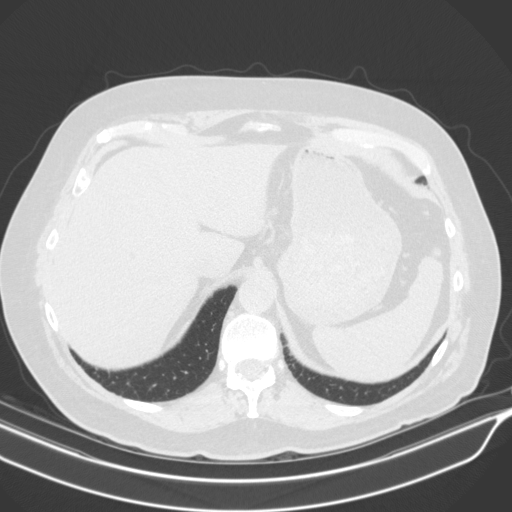
[im 34/147  lung]
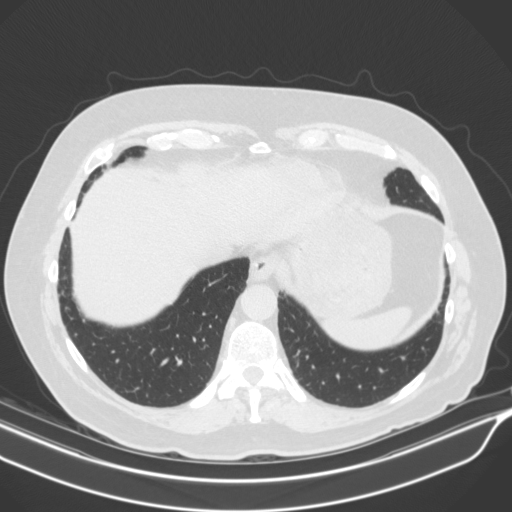
[im 45/147  lung]
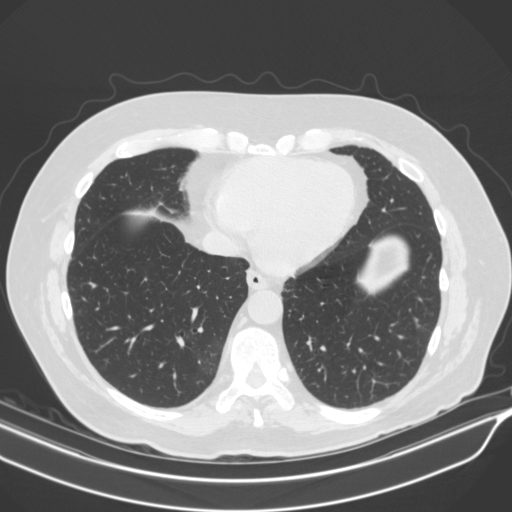
[im 57/147  mediastinal]
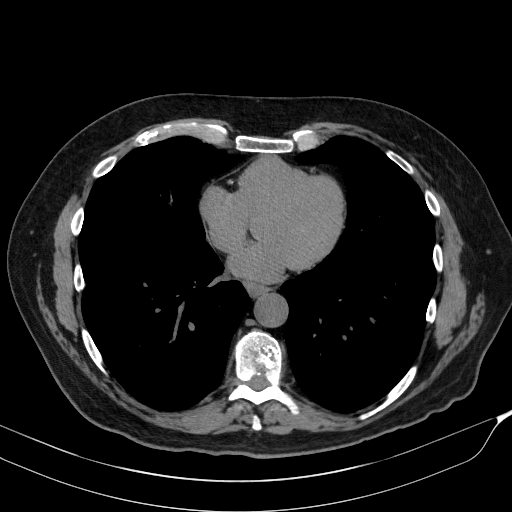
[im 57/147  lung]
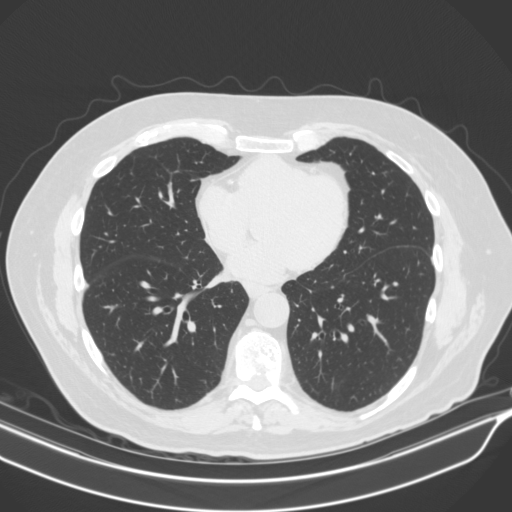
[im 68/147  lung]
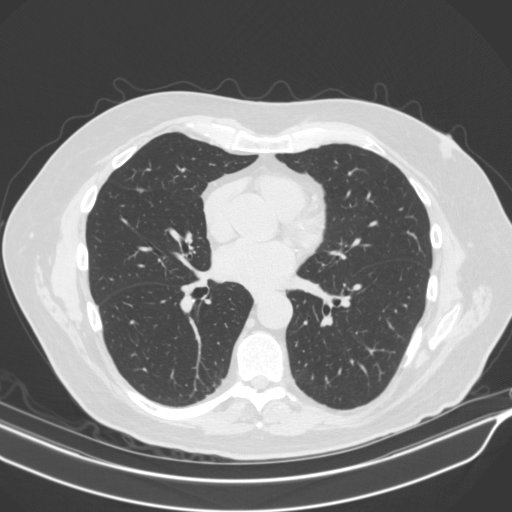
[im 70/147  lung]
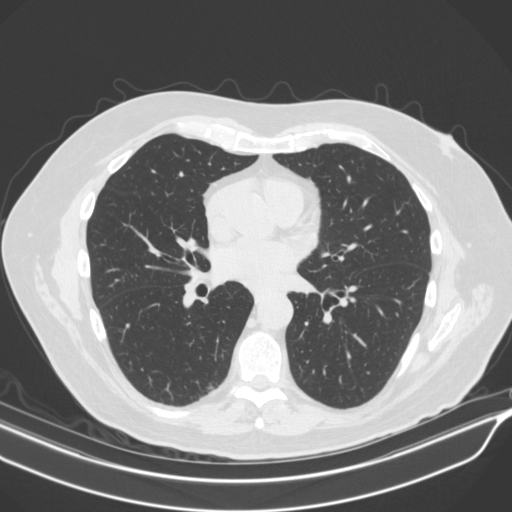
[im 74/147  lung]
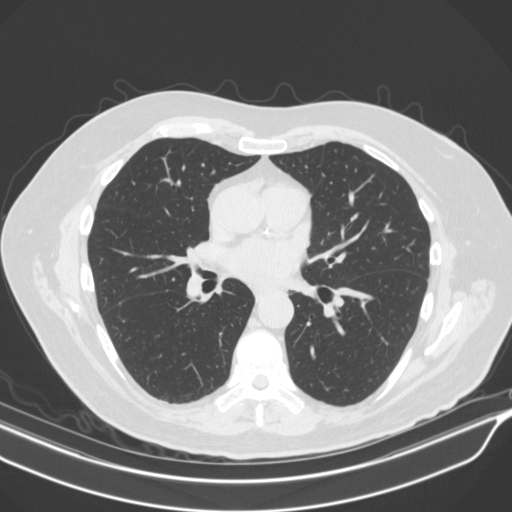
[im 79/147  mediastinal]
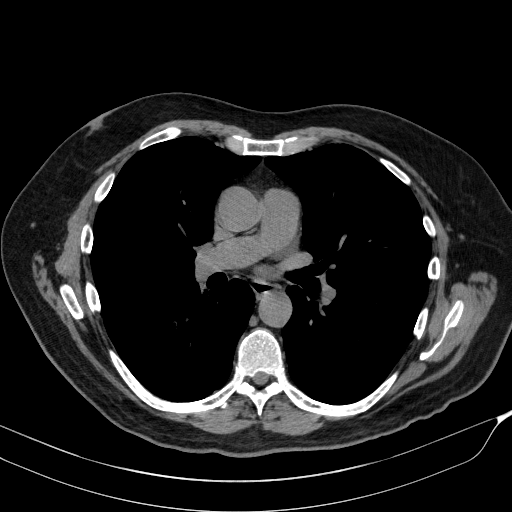
[im 79/147  lung]
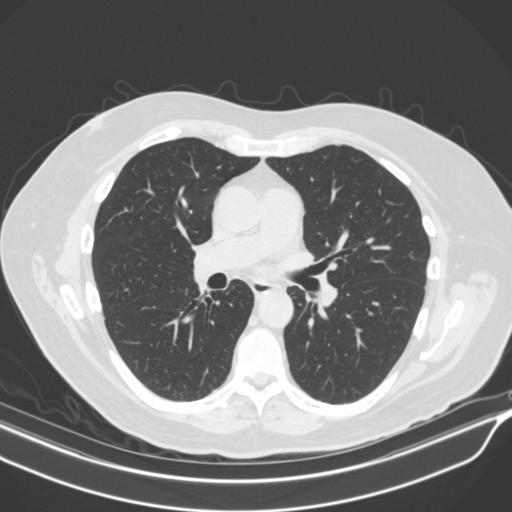
[im 90/147  lung]
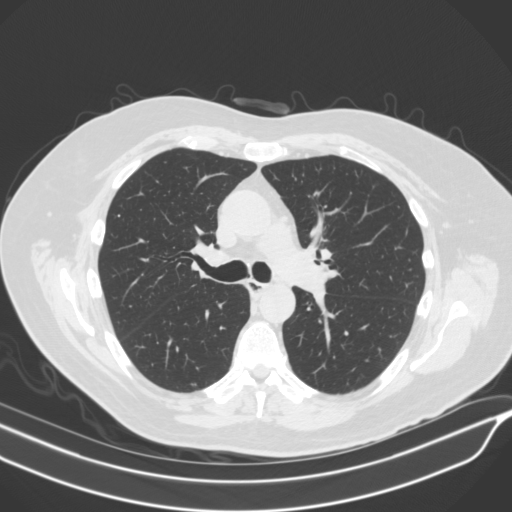
[im 102/147  lung]
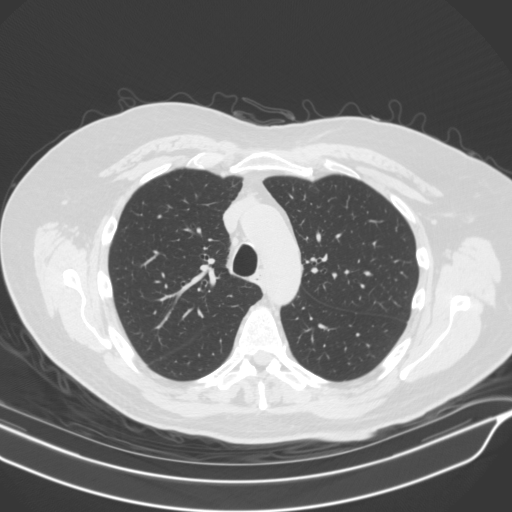
[im 113/147  lung]
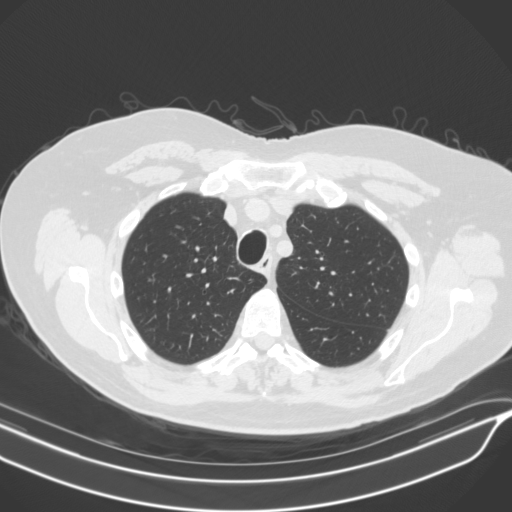
[im 124/147  mediastinal]
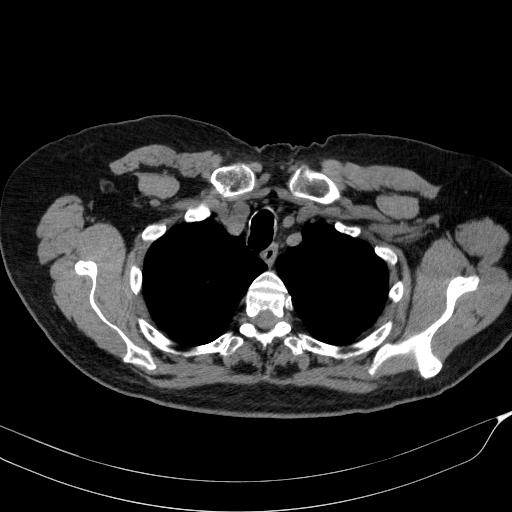
[im 124/147  lung]
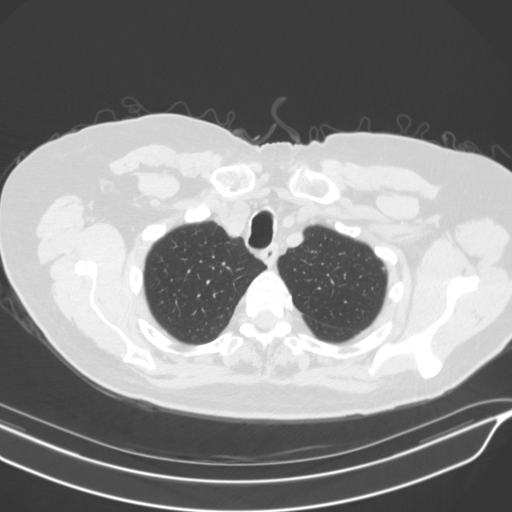
[im 135/147  lung]
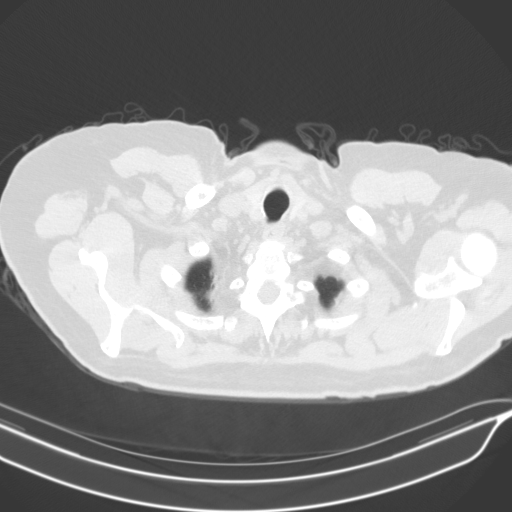

[14 of 32 positions shown; findings below may reference images not displayed]

FINDINGS: Mediastinum/Lymph Nodes: No hilar or mediastinal adenopathy is
identified. The thoracic inlet is within normal limits. The
esophagus as visualized is within normal limits.

Cardiovascular: No contrast was administered for this exam. The
cardiac silhouette is within normal limits. Mild coronary
calcifications are seen. No aneurysmal dilatation of the thoracic
aorta is noted.

Lungs/Pleura: No pulmonary mass, infiltrate, or effusion.

Upper abdomen: The visualized upper abdomen is within normal limits.

Musculoskeletal: The osseous structures show mild degenerative
changes of the thoracic spine. No bony abnormality to correspond
with that seen on recent chest x-ray is noted. This was likely
related to a confluence of bony shadows.
IMPRESSION: No acute abnormality is identified. The nodular density seen on
recent chest x-ray is felt to be related to a confluence of shadows
as no parenchymal or bony lesion is identified.

Mild coronary calcifications.

## 2017-04-21 ENCOUNTER — Encounter (HOSPITAL_BASED_OUTPATIENT_CLINIC_OR_DEPARTMENT_OTHER): Payer: Medicare Other

## 2017-06-17 ENCOUNTER — Encounter (HOSPITAL_BASED_OUTPATIENT_CLINIC_OR_DEPARTMENT_OTHER): Payer: Medicare Other

## 2018-02-01 ENCOUNTER — Other Ambulatory Visit (HOSPITAL_COMMUNITY): Payer: Self-pay | Admitting: Psychiatry

## 2018-02-12 ENCOUNTER — Other Ambulatory Visit (HOSPITAL_COMMUNITY): Payer: Self-pay | Admitting: Psychiatry

## 2018-02-16 ENCOUNTER — Other Ambulatory Visit (HOSPITAL_COMMUNITY): Payer: Self-pay | Admitting: Psychiatry

## 2018-02-18 ENCOUNTER — Other Ambulatory Visit (HOSPITAL_COMMUNITY): Payer: Self-pay | Admitting: Psychiatry

## 2018-03-24 ENCOUNTER — Other Ambulatory Visit: Payer: Self-pay | Admitting: Family Medicine

## 2018-03-24 DIAGNOSIS — M5416 Radiculopathy, lumbar region: Secondary | ICD-10-CM

## 2018-03-24 DIAGNOSIS — M5412 Radiculopathy, cervical region: Secondary | ICD-10-CM

## 2018-03-25 ENCOUNTER — Other Ambulatory Visit (HOSPITAL_COMMUNITY): Payer: Self-pay | Admitting: Family Medicine

## 2018-03-25 ENCOUNTER — Encounter (HOSPITAL_COMMUNITY): Payer: Medicare Other

## 2018-03-25 DIAGNOSIS — R29898 Other symptoms and signs involving the musculoskeletal system: Secondary | ICD-10-CM

## 2018-03-27 ENCOUNTER — Ambulatory Visit (HOSPITAL_COMMUNITY)
Admission: RE | Admit: 2018-03-27 | Discharge: 2018-03-27 | Disposition: A | Discharge status: Home / Self Care | Payer: Medicare Other | Source: Ambulatory Visit | Attending: Vascular Surgery | Admitting: Vascular Surgery

## 2018-03-27 DIAGNOSIS — R29898 Other symptoms and signs involving the musculoskeletal system: Secondary | ICD-10-CM | POA: Insufficient documentation

## 2018-04-07 ENCOUNTER — Ambulatory Visit
Admission: RE | Admit: 2018-04-07 | Discharge: 2018-04-07 | Disposition: A | Discharge status: Home / Self Care | Payer: Medicare Other | Source: Ambulatory Visit | Attending: Family Medicine | Admitting: Family Medicine

## 2018-04-07 ENCOUNTER — Telehealth: Payer: Self-pay | Admitting: *Deleted

## 2018-04-07 DIAGNOSIS — M5416 Radiculopathy, lumbar region: Secondary | ICD-10-CM

## 2018-04-07 DIAGNOSIS — M5412 Radiculopathy, cervical region: Secondary | ICD-10-CM

## 2018-04-07 NOTE — Telephone Encounter (Signed)
I called pt to offer appt since he was on wait list for sooner appt. Offered 04/08/18 at 12pm, 04/09/18 at 8am and 04/15/18 at 230pm. He declined appt times stating he either had appt already or lunch scheduled with friends. Advised I will keep him on wait list and will call if anything else becomes available. He verbalized understanding and appreciation for call.

## 2018-04-21 NOTE — Telephone Encounter (Signed)
Called and spoke w/ pt. Offered appt for 04/22/18 at 11am, check in 10:30am. Pt agreeable to date/time. I scheduled pt to see Dr. Anne HahnWillis. Advised him to bring updated insurance cards.

## 2018-04-22 ENCOUNTER — Other Ambulatory Visit: Payer: Self-pay

## 2018-04-22 ENCOUNTER — Ambulatory Visit (INDEPENDENT_AMBULATORY_CARE_PROVIDER_SITE_OTHER): Payer: Medicare Other | Admitting: Neurology

## 2018-04-22 ENCOUNTER — Encounter: Payer: Self-pay | Admitting: Neurology

## 2018-04-22 VITALS — BP 155/71 | HR 60 | Ht 69.0 in | Wt 177.5 lb

## 2018-04-22 DIAGNOSIS — E538 Deficiency of other specified B group vitamins: Secondary | ICD-10-CM | POA: Diagnosis not present

## 2018-04-22 DIAGNOSIS — M79604 Pain in right leg: Secondary | ICD-10-CM | POA: Diagnosis not present

## 2018-04-22 DIAGNOSIS — G912 (Idiopathic) normal pressure hydrocephalus: Secondary | ICD-10-CM | POA: Diagnosis not present

## 2018-04-22 DIAGNOSIS — M79605 Pain in left leg: Secondary | ICD-10-CM | POA: Diagnosis not present

## 2018-04-22 HISTORY — DX: (Idiopathic) normal pressure hydrocephalus: G91.2

## 2018-04-22 NOTE — Progress Notes (Signed)
Reason for visit: Leg pain  Clayton Burke is an 77 y.o. male  History of present illness:  Clayton Burke is a 77 year old right-handed white male with a history of normal pressure hydrocephalus.  The patient has had a VP shunt placed and he has done quite well, his ability to ambulate has essentially normalized.  The patient has not had any falls in 2 years.  The patient comes in with a new problem, he indicates that over the last 4 or 5 months he has developed some discomfort in the legs that occurs with walking, better with rest.  The patient has had MRI of the cervical spine and lumbar spine that does not show evidence of spinal stenosis or spinal cord impingement.  The patient claims he has undergone vascular studies of the lower extremities that do not show evidence of arterial disease affecting the legs.  The patient has been taken off of his statin drug 1 month ago, he still has symptoms.  Surprisingly, the patient claims that when he walks on the treadmill he does not have problems.  If he walks normally, he will have discomfort after walking about 100 yards.  He denies issues controlling the bowels or the bladder.  He has no true weakness of the legs.  The patient does report some discomfort going up the legs to the back with walking.  He comes to this office for an evaluation.  Past Medical History:  Diagnosis Date  . Abnormality of gait 05/12/2013  . ADD (attention deficit disorder)   . Bipolar 1 disorder (HCC)   . BPH (benign prostatic hyperplasia)   . 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 DDD (degenerative disc disease), cervical   . Depression   . Diverticulosis   . Gout   . HTN (hypertension)   . Hydrocephalus, communicating   . Hyperlipidemia   . LBBB (left bundle branch block)   . Migraine   . NPH (normal pressure hydrocephalus) 04/22/2018  . PVC (premature ventricular contraction)   . Radiculopathy    lumbar with right leg pain  . 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 Zoster     Past Surgical History:  Procedure Laterality Date  . BELPHAROPTOSIS REPAIR    . CARDIAC CATHETERIZATION N/A 05/16/2016   Procedure: Left Heart Cath and Coronary Angiography;  Surgeon: Corky CraftsJayadeep S Varanasi, MD;  [facility];  Service: Cardiovascular;  Laterality: N/A;  . CATARACT EXTRACTION    . TONSILLECTOMY AND ADENOIDECTOMY      Family History  Problem Relation Age of Onset  . Hypertension Father   . Stroke Father   . Uterine cancer Mother   . Parkinsonism Maternal Uncle   . Bipolar disorder Brother     Social history:  reports that he quit smoking about 40 years ago. His smoking use included cigarettes. He has a 24.00 pack-year smoking history. He has never used smokeless tobacco. He reports that he drinks about 8.4 oz of alcohol per week. He reports that he does not use drugs.   No Known Allergies  Medications:  Prior to Admission medications   Medication Sig Start Date End Date Taking? Authorizing Provider  ALPRAZolam Prudy Feeler(XANAX) 0.5 MG tablet Take 2 tablets (1 mg total) by mouth at bedtime. 05/21/16  Yes Lewayne Buntingrenshaw, Brian S, MD  finasteride (PROSCAR) 5 MG tablet Take 5 mg by mouth daily. 11/06/13  Yes [provider]  FLUoxetine (PROZAC) 20 MG tablet Take 20 mg by mouth daily.  Yes [provider]  hydrochlorothiazide (HYDRODIURIL) 25 MG tablet Take 25 mg by mouth daily.   Yes [provider]  HYDROcodone-acetaminophen (NORCO/VICODIN) 5-325 MG per tablet Take 1 tablet by mouth every 6 (six) hours as needed for moderate pain.    Yes [provider]  lamoTRIgine (LAMICTAL) 200 MG tablet Take 200 mg by mouth 2 (two) times daily.   Yes [provider]  losartan (COZAAR) 50 MG tablet Take 50 mg by mouth daily.   Yes [provider]  LYRICA 100 MG capsule Take 100 mg by mouth at bedtime.  05/01/16  Yes [provider]  metoprolol succinate (TOPROL-XL) 25 MG 24 hr tablet Take 25 mg by mouth daily.    Yes [provider]  tamsulosin (FLOMAX) 0.4 MG CAPS Take 0.8 mg by mouth daily after supper.    Yes [provider]    ROS:  Out of a complete 14 system review of symptoms, the patient complains only of the following symptoms, and all other reviewed systems are negative.  Hearing loss Difficulty walking Difficulty urinating Bruising easily Memory loss  Blood pressure (!) 155/71, pulse 60, height 5\' 9"  (1.753 m), weight 177 lb 8 oz (80.5 kg).  Physical Exam  General: The patient is alert and cooperative at the time of the examination.  Skin: No significant peripheral edema is noted.   Neurologic Exam  Mental status: The patient is alert and oriented x 3 at the time of the examination. The patient has apparent normal recent and remote memory, with an apparently normal attention span and concentration ability.   Cranial nerves: Facial symmetry is present. Speech is normal, no aphasia or dysarthria is noted. Extraocular movements are full. Visual fields are full.  Motor: The patient has good strength in all 4 extremities.  Sensory examination: Soft touch sensation is symmetric on the face, arms, and legs.  Coordination: The patient has good finger-nose-finger and heel-to-shin bilaterally.  Gait and station: The patient has a normal gait. Tandem gait is unsteady. Romberg is negative. No drift is seen.  Reflexes: Deep tendon reflexes are symmetric.   Arterial doppler 03/27/18:  Final Interpretation: Right: Resting right ankle-brachial index is within normal range. No evidence of significant right lower extremity arterial disease. The right toe-brachial index is normal.  Left: Resting left ankle-brachial index is within normal range. No evidence of significant left lower extremity arterial disease. The left toe-brachial index is normal.   MRI lumbar 04/07/18:  IMPRESSION: Mild spinal stenosis L4-5 unchanged. Mild subarticular stenosis on the left also  unchanged  Bilateral facet degeneration L5-S1 without significant stenosis.   Assessment/Plan:  1.  Normal pressure hydrocephalus, status post VP shunt  2.  Claudication syndrome, bilateral legs  The etiology of the leg discomfort with walking is not clear.  The patient apparently has no evidence of lumbosacral spinal stenosis or evidence of peripheral arterial disease.  The patient will call me in about 3 months if he does not improve off of the cholesterol drugs, we will consider EMG and nerve conduction study at that time. I suppose facet joint arthritis could result in some leg pain with prolonged weightbearing.  Marlan Palau MD 04/22/2018 11:21 AM  [facility] [address] [place], [place] [address]  Phone [phone] Fax [phone]

## 2018-04-23 ENCOUNTER — Other Ambulatory Visit (INDEPENDENT_AMBULATORY_CARE_PROVIDER_SITE_OTHER): Payer: Self-pay

## 2018-04-23 DIAGNOSIS — Z0289 Encounter for other administrative examinations: Secondary | ICD-10-CM

## 2018-04-24 LAB — SEDIMENTATION RATE: SED RATE: 2 mm/h (ref 0–30)

## 2018-04-24 LAB — ANGIOTENSIN CONVERTING ENZYME: Angio Convert Enzyme: 38 U/L (ref 14–82)

## 2018-04-24 LAB — B. BURGDORFI ANTIBODIES

## 2018-04-24 LAB — CK: Total CK: 41 U/L (ref 24–204)

## 2018-04-24 LAB — ANA W/REFLEX: Anti Nuclear Antibody(ANA): NEGATIVE

## 2018-04-24 LAB — VITAMIN B12: Vitamin B-12: 240 pg/mL (ref 232–1245)

## 2018-04-26 ENCOUNTER — Other Ambulatory Visit: Payer: Self-pay

## 2018-04-26 ENCOUNTER — Emergency Department (HOSPITAL_COMMUNITY): Payer: Medicare Other

## 2018-04-26 ENCOUNTER — Emergency Department (HOSPITAL_COMMUNITY)
Admission: EM | Admit: 2018-04-26 | Discharge: 2018-04-27 | Disposition: A | Discharge status: Home / Self Care | Payer: Medicare Other | Attending: Emergency Medicine | Admitting: Emergency Medicine

## 2018-04-26 ENCOUNTER — Encounter (HOSPITAL_COMMUNITY): Payer: Self-pay | Admitting: Emergency Medicine

## 2018-04-26 DIAGNOSIS — Y9289 Other specified places as the place of occurrence of the external cause: Secondary | ICD-10-CM | POA: Insufficient documentation

## 2018-04-26 DIAGNOSIS — R51 Headache: Secondary | ICD-10-CM | POA: Insufficient documentation

## 2018-04-26 DIAGNOSIS — Z79899 Other long term (current) drug therapy: Secondary | ICD-10-CM | POA: Diagnosis not present

## 2018-04-26 DIAGNOSIS — S42031A Displaced fracture of lateral end of right clavicle, initial encounter for closed fracture: Secondary | ICD-10-CM | POA: Insufficient documentation

## 2018-04-26 DIAGNOSIS — I10 Essential (primary) hypertension: Secondary | ICD-10-CM | POA: Insufficient documentation

## 2018-04-26 DIAGNOSIS — Z87891 Personal history of nicotine dependence: Secondary | ICD-10-CM | POA: Diagnosis not present

## 2018-04-26 DIAGNOSIS — Y999 Unspecified external cause status: Secondary | ICD-10-CM | POA: Diagnosis not present

## 2018-04-26 DIAGNOSIS — W109XXA Fall (on) (from) unspecified stairs and steps, initial encounter: Secondary | ICD-10-CM | POA: Insufficient documentation

## 2018-04-26 DIAGNOSIS — S0083XA Contusion of other part of head, initial encounter: Secondary | ICD-10-CM | POA: Diagnosis not present

## 2018-04-26 DIAGNOSIS — S4991XA Unspecified injury of right shoulder and upper arm, initial encounter: Secondary | ICD-10-CM | POA: Diagnosis present

## 2018-04-26 DIAGNOSIS — Y9339 Activity, other involving climbing, rappelling and jumping off: Secondary | ICD-10-CM | POA: Diagnosis not present

## 2018-04-26 NOTE — ED Triage Notes (Signed)
Pt reports going up steps and tripped and fell landing on right shoulder. Pt reports injury occurred around 2200.

## 2018-04-27 ENCOUNTER — Emergency Department (HOSPITAL_COMMUNITY): Payer: Medicare Other

## 2018-04-27 DIAGNOSIS — S42031A Displaced fracture of lateral end of right clavicle, initial encounter for closed fracture: Secondary | ICD-10-CM | POA: Diagnosis not present

## 2018-04-27 MED ORDER — HYDROCODONE-ACETAMINOPHEN 5-325 MG PO TABS
1.0000 | ORAL_TABLET | Freq: Once | ORAL | Status: AC
  Administered 2018-04-27: 1 via ORAL
  Filled 2018-04-27: qty 1

## 2018-04-27 NOTE — Discharge Instructions (Signed)
He was seen in the ER for your shoulder pain. X-ray of the shoulder reveals that you have a collarbone fracture. Fortunately, most of the fractures heal on their own and we need to immobilize your shoulder in a sling. Ice the shoulder 3-4 times a day for the next week. See the orthopedic doctor in 7 to 14 days.

## 2018-05-05 NOTE — ED Provider Notes (Signed)
[facility]-EMERGENCY DEPT Provider Note   CSN: [id number] Arrival date & time: 04/26/18  2222     History   Chief Complaint Chief Complaint  Patient presents with  . Shoulder Injury    HPI Clayton Burke is a 77 y.o. male.  HPI  77 year old male comes in with chief complaint of fall and resultant shoulder pain. Patient states that he was climbing stairs, lost his balance and fell onto his right side.  Patient had immediate pain to the right shoulder and is having difficulty moving it.  Patient denies any associated numbness, tingling.  Patient is also noted to have a bruise to his forehead.  He denies taking any blood thinners.  Review of system is negative for pain in the hips or lower extremity, abdomen or back.  Past Medical History:  Diagnosis Date  . Abnormality of gait 05/12/2013  . ADD (attention deficit disorder)   . Bipolar 1 disorder (HCC)   . BPH (benign prostatic hyperplasia)   . 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 DDD (degenerative disc disease), cervical   . Depression   . Diverticulosis   . Gout   . HTN (hypertension)   . Hydrocephalus, communicating   . Hyperlipidemia   . LBBB (left bundle branch block)   . Migraine   . NPH (normal pressure hydrocephalus) 04/22/2018  . PVC (premature ventricular contraction)   . Radiculopathy    lumbar with right leg pain  . 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 Zoster     Patient Active Problem List   Diagnosis Date Noted  . NPH (normal pressure hydrocephalus) 04/22/2018  . Abnormal nuclear stress test   . Pre-op testing   . 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 Abnormality of gait 05/12/2013  . LBBB (left bundle branch block) 03/18/2013  . PVC (premature ventricular contraction) 03/18/2013    Past Surgical History:  Procedure Laterality Date  . BELPHAROPTOSIS REPAIR    . CARDIAC CATHETERIZATION N/A 05/16/2016   Procedure: Left Heart  Cath and Coronary Angiography;  Surgeon: Corky CraftsJayadeep S Varanasi, MD;  [facility];  Service: Cardiovascular;  Laterality: N/A;  . CATARACT EXTRACTION    . TONSILLECTOMY AND ADENOIDECTOMY          Home Medications    Prior to Admission medications   Medication Sig Start Date End Date Taking? Authorizing Provider  ALPRAZolam Prudy Feeler(XANAX) 0.5 MG tablet Take 2 tablets (1 mg total) by mouth at bedtime. 05/21/16   Lewayne Buntingrenshaw, Brian S, MD  finasteride (PROSCAR) 5 MG tablet Take 5 mg by mouth daily. 11/06/13   [provider]  FLUoxetine (PROZAC) 20 MG tablet Take 20 mg by mouth daily.    [provider]  hydrochlorothiazide (HYDRODIURIL) 25 MG tablet Take 25 mg by mouth daily.    [provider]  HYDROcodone-acetaminophen (NORCO/VICODIN) 5-325 MG per tablet Take 1 tablet by mouth every 6 (six) hours as needed for moderate pain.     [provider]  lamoTRIgine (LAMICTAL) 200 MG tablet Take 200 mg by mouth 2 (two) times daily.    [provider]  losartan (COZAAR) 50 MG tablet Take 50 mg by mouth daily.    [provider]  LYRICA 100 MG capsule Take 100 mg by mouth at bedtime.  05/01/16   [provider]  metoprolol succinate (TOPROL-XL) 25 MG 24 hr tablet Take 25 mg by mouth daily.    [provider]  tamsulosin (FLOMAX) 0.4  MG CAPS Take 0.8 mg by mouth daily after supper.     [provider]    Family History Family History  Problem Relation Age of Onset  . Hypertension Father   . Stroke Father   . Uterine cancer Mother   . Parkinsonism Maternal Uncle   . Bipolar disorder Brother     Social History Social History   Tobacco Use  . Smoking status: Former Smoker    Packs/day: 2.00    Years: 12.00    Pack years: 24.00    Types: Cigarettes    Last attempt to quit: 07/24/1977    Years since quitting: 40.8  . Smokeless tobacco: Never Used  Substance Use Topics  . Alcohol use: Yes    Alcohol/week: 8.4  oz    Types: 14 Glasses of wine per week    Comment: 2 glasses wine per night  . Drug use: No     Allergies   Patient has no known allergies.   Review of Systems Review of Systems  Constitutional: Positive for activity change.  Respiratory: Negative for shortness of breath.   Gastrointestinal: Negative for abdominal pain.  Musculoskeletal: Positive for arthralgias.  Skin: Positive for rash.  Neurological: Negative for headaches.  Hematological: Does not bruise/bleed easily.     Physical Exam Updated Vital Signs BP (!) 150/88   Pulse 73   Temp 98 F (36.7 C) (Oral)   Resp 15   SpO2 99%   Physical Exam  Constitutional: He is oriented to person, place, and time. He appears well-developed.  HENT:  Head: Atraumatic.  Neck: Neck supple.  Cardiovascular: Normal rate and intact distal pulses.  Pulmonary/Chest: Effort normal.  Musculoskeletal:  Patient is noted to have tenderness over the proximal shoulder with edema.  Also appears that patient has tenderness over the distal clavicular region.  Head to toe evaluation shows mild hematoma to the forehead. No bleeding of the scalp, no facial abrasions, no spine step offs, crepitus of the chest or neck, no tenderness to palpation of the bilateral upper and lower extremities, no gross deformities, no chest tenderness, no pelvic pain.   Neurological: He is alert and oriented to person, place, and time.  Skin: Skin is warm.  Nursing note and vitals reviewed.    ED Treatments / Results  Labs (all labs ordered are listed, but only abnormal results are displayed) Labs Reviewed - No data to display  EKG None  Radiology No results found.  Procedures Procedures (including critical care time)  Medications Ordered in ED Medications  HYDROcodone-acetaminophen (NORCO/VICODIN) 5-325 MG per tablet 1 tablet (1 tablet Oral Given 04/27/18 0038)     Initial Impression / Assessment and Plan / ED Course  I have reviewed the triage  vital signs and the nursing notes.  Pertinent labs & imaging results that were available during my care of the patient were reviewed by me and considered in my medical decision making (see chart for details).     77 year old male comes in with chief complaint of fall.  On exam he is noted to have mainly right-sided shoulder pain along with hematoma.  Given the age and trauma to the head, we will get a CT brain to make sure there is no subdural.  Additionally, x-ray of the upper extremity was acquired, and patient is noted to have right-sided distal clavicular fracture.  Sling has been applied. Patient is neurovascularly intact and appropriate follow-up has been provided.  Final Clinical Impressions(s) / ED Diagnoses  Final diagnoses:  Closed displaced fracture of acromial end of right clavicle, initial encounter    ED Discharge Orders    None       Derwood Kaplan, MD 05/05/18 [phone]

## 2018-08-21 ENCOUNTER — Encounter

## 2018-08-21 ENCOUNTER — Ambulatory Visit: Payer: Medicare Other | Admitting: Neurology

## 2018-11-11 ENCOUNTER — Other Ambulatory Visit: Payer: Self-pay | Admitting: Family Medicine

## 2018-11-11 ENCOUNTER — Ambulatory Visit
Admission: RE | Admit: 2018-11-11 | Discharge: 2018-11-11 | Disposition: A | Discharge status: Home / Self Care | Payer: Medicare Other | Source: Ambulatory Visit | Attending: Family Medicine | Admitting: Family Medicine

## 2018-11-11 DIAGNOSIS — R053 Chronic cough: Secondary | ICD-10-CM

## 2018-11-11 DIAGNOSIS — R05 Cough: Secondary | ICD-10-CM

## 2019-09-28 ENCOUNTER — Other Ambulatory Visit: Payer: Self-pay

## 2019-09-28 ENCOUNTER — Ambulatory Visit
Admission: RE | Admit: 2019-09-28 | Discharge: 2019-09-28 | Disposition: A | Discharge status: Home / Self Care | Payer: Medicare Other | Source: Ambulatory Visit | Attending: Family Medicine | Admitting: Family Medicine

## 2019-09-28 ENCOUNTER — Other Ambulatory Visit: Payer: Self-pay | Admitting: Family Medicine

## 2019-09-28 DIAGNOSIS — M25552 Pain in left hip: Secondary | ICD-10-CM

## 2019-09-28 DIAGNOSIS — M79605 Pain in left leg: Secondary | ICD-10-CM

## 2019-11-08 ENCOUNTER — Ambulatory Visit (INDEPENDENT_AMBULATORY_CARE_PROVIDER_SITE_OTHER): Payer: Medicare Other | Admitting: Neurology

## 2019-11-08 ENCOUNTER — Other Ambulatory Visit: Payer: Self-pay

## 2019-11-08 ENCOUNTER — Encounter: Payer: Self-pay | Admitting: Neurology

## 2019-11-08 VITALS — BP 124/66 | HR 69 | Ht 69.0 in | Wt 202.4 lb

## 2019-11-08 DIAGNOSIS — G912 (Idiopathic) normal pressure hydrocephalus: Secondary | ICD-10-CM

## 2019-11-08 DIAGNOSIS — R413 Other amnesia: Secondary | ICD-10-CM | POA: Diagnosis not present

## 2019-11-08 NOTE — Progress Notes (Signed)
NEUROLOGY CONSULTATION NOTE  Clayton Burke MRN: [id number] DOB: October 24, 1941  Referring provider: Dr. Mosetta Putt Primary care provider: Dr. Mosetta Putt  Reason for consult:  Memory evaluation  Dear Dr Clayton Burke:  Thank you for your kind referral of Clayton Burke for consultation of the above symptoms. Although his history is well known to you, please allow me to reiterate it for the purpose of our medical record. The patient was accompanied to the clinic by his wife who also provides collateral information. Records and images were personally reviewed where available.   HISTORY OF PRESENT ILLNESS: This is a pleasant 79 year old right-handed man with a history of hypertension, hyperlipidemia, bipolar disorder, NPH s/p VP shunt, presenting for memory evaluation. Records were reviewed. He had gait instability with shuffling and frequent falls that started around 2014/2015, leading to an evaluation at [facility] in 2017. Records from his visit indicate that he had noticed a gradual cognitive decline with difficulty remembering names. His wife had reported it was more extensive than that, she is not present during today's visit. Per notes, he had difficulty processing complex information during conversations, more difficulty using electronics, and difficulty with managing household finances. At one point he had a DAT scan as part of workup for Parkinson's disease, which was normal. He had several lumbar puncture studies done, with pre- and post-testing with note of improvement in gait. He underwent shunt placement in 05/2016 with significant improvement in gait, he had no further falls and gait was back to normal. Shunt setting at that time was Certas valve 5. He was having progressively worse gait difficulties in June/July and had a fall prior to his July visit to [facility]. He felt like his legs were heavy. He admitted to drinking at least 1 bottle of wine/night. Head CT showed an  acute to subacute left frontoparietal subdural hematoma, stable mild to moderate ventriculomegaly. Shunt was turned off. Repeat brain imaging the month after showed significant reduction in the left frontal convexity SDH, and follow-up scans were stable. I personally reviewed images. The last head CT done in 09/2019 showed small left frontal subdural collection, unchanged from prior, stable ventriculomegaly. While the shunt was off, he was having an increased frequency in falls again, and was using a cane. Shunt was reprogrammed to Certas 7, with plans for follow-up imaging and hopeful return to baseline settings as tolerated. During his follow-up at [facility], his wife voiced concern for cognitive changes seemingly worse with distractions. He feels that his cognition did not change pre- or post-shunt, not any worse when the shunt was off.He recalls MOCA testing at [facility] in the past to be 27/30. He continues to drive and denies getting lost. His wife manages finances. He denies missing medications. He denies misplacing things. His wife has told him he repeats himself. Sleep is great. He takes Xanax and Pregabalin for sleep. He is on Lamotrigine for bipolar disorder. He rarely takes Norco for back pain. He stopped drinking alcohol after the fall in July. Mood is fine. He denies any headaches, dizziness, diplopia, dysarthria/dysphagia, neck pain, focal numbness/tingling/weakness, bowel/bladder dysfunction, anosmia, or tremors. His mother and father had memory issues (father may have had NPH as well).    PAST MEDICAL HISTORY: Past Medical History:  Diagnosis Date  . Abnormality of gait 05/12/2013  . ADD (attention deficit disorder)   . Bipolar 1 disorder (HCC)   . BPH (benign prostatic hyperplasia)   . 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 DDD (degenerative  disc disease), cervical   . Depression   . Diverticulosis   . Gout   . HTN (hypertension)   . Hydrocephalus, communicating   . Hyperlipidemia   . LBBB  (left bundle branch block)   . Migraine   . NPH (normal pressure hydrocephalus) 04/22/2018  . PVC (premature ventricular contraction)   . Radiculopathy    lumbar with right leg pain  . 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 Zoster     PAST SURGICAL HISTORY: Past Surgical History:  Procedure Laterality Date  . BELPHAROPTOSIS REPAIR    . CARDIAC CATHETERIZATION N/A 05/16/2016   Procedure: Left Heart Cath and Coronary Angiography;  Surgeon: Corky Crafts, MD;  Location: [facility] INVASIVE CV LAB;  Service: Cardiovascular;  Laterality: N/A;  . CATARACT EXTRACTION    . TONSILLECTOMY AND ADENOIDECTOMY      MEDICATIONS: Current Outpatient Medications on File Prior to Visit  Medication Sig Dispense Refill  . ALPRAZolam (XANAX) 0.5 MG tablet Take 2 tablets (1 mg total) by mouth at bedtime.    . finasteride (PROSCAR) 5 MG tablet Take 5 mg by mouth daily.    Marland Kitchen FLUoxetine (PROZAC) 20 MG tablet Take 20 mg by mouth daily.    . hydrochlorothiazide (HYDRODIURIL) 25 MG tablet Take 25 mg by mouth daily.    Marland Kitchen HYDROcodone-acetaminophen (NORCO/VICODIN) 5-325 MG per tablet Take 1 tablet by mouth every 6 (six) hours as needed for moderate pain.     Marland Kitchen lamoTRIgine (LAMICTAL) 200 MG tablet Take 200 mg by mouth 2 (two) times daily.    Marland Kitchen losartan (COZAAR) 50 MG tablet Take 50 mg by mouth daily.    Marland Kitchen LYRICA 100 MG capsule Take 100 mg by mouth at bedtime.   0  . metoprolol succinate (TOPROL-XL) 25 MG 24 hr tablet Take 25 mg by mouth daily.    . tamsulosin (FLOMAX) 0.4 MG CAPS Take 0.8 mg by mouth daily after supper.      No current facility-administered medications on file prior to visit.    ALLERGIES: No Known Allergies  FAMILY HISTORY: Family History  Problem Relation Age of Onset  . Hypertension Father   . Stroke Father   . Uterine cancer Mother   . Parkinsonism Maternal Uncle   . Bipolar disorder Brother     SOCIAL HISTORY: Social History   Socioeconomic History  . Marital status: Married     Spouse name: Not on file  . Number of children: 1  . Years of education: 81  . Highest education level: Not on file  Occupational History  . Occupation: Retired  Tobacco Use  . Smoking status: Former Smoker    Packs/day: 2.00    Years: 12.00    Pack years: 24.00    Types: Cigarettes    Quit date: 07/24/1977    Years since quitting: 42.3  . Smokeless tobacco: Never Used  Substance and Sexual Activity  . Alcohol use: Yes    Alcohol/week: 14.0 standard drinks    Types: 14 Glasses of wine per week    Comment: 2 glasses wine per night  . Drug use: No  . Sexual activity: Not on file  Other Topics Concern  . Not on file  Social History Narrative   Lives with his wife, Joyce Gross.   Patient is right handed.   Patient drinks 2 diet sodas daily.   Social Determinants of Health   Financial Resource Strain:   . Difficulty of Paying Living Expenses: Not on file  Food Insecurity:   .  Worried About Programme researcher, broadcasting/film/video in the Last Year: Not on file  . Ran Out of Food in the Last Year: Not on file  Transportation Needs:   . Lack of Transportation (Medical): Not on file  . Lack of Transportation (Non-Medical): Not on file  Physical Activity:   . Days of Exercise per Week: Not on file  . Minutes of Exercise per Session: Not on file  Stress:   . Feeling of Stress : Not on file  Social Connections:   . Frequency of Communication with Friends and Family: Not on file  . Frequency of Social Gatherings with Friends and Family: Not on file  . Attends Religious Services: Not on file  . Active Member of Clubs or Organizations: Not on file  . Attends Banker Meetings: Not on file  . Marital Status: Not on file  Intimate Partner Violence:   . Fear of Current or Ex-Partner: Not on file  . Emotionally Abused: Not on file  . Physically Abused: Not on file  . Sexually Abused: Not on file    REVIEW OF SYSTEMS: Constitutional: No fevers, chills, or sweats, no generalized fatigue,  change in appetite Eyes: No visual changes, double vision, eye pain Ear, nose and throat: No hearing loss, ear pain, nasal congestion, sore throat Cardiovascular: No chest pain, palpitations Respiratory:  No shortness of breath at rest or with exertion, wheezes GastrointestinaI: No nausea, vomiting, diarrhea, abdominal pain, fecal incontinence Genitourinary:  No dysuria, urinary retention or frequency Musculoskeletal:  No neck pain, back pain Integumentary: No rash, pruritus, skin lesions Neurological: as above Psychiatric: No depression, insomnia, anxiety Endocrine: No palpitations, fatigue, diaphoresis, mood swings, change in appetite, change in weight, increased thirst Hematologic/Lymphatic:  No anemia, purpura, petechiae. Allergic/Immunologic: no itchy/runny eyes, nasal congestion, recent allergic reactions, rashes  PHYSICAL EXAM: Vitals:   11/08/19 1047  BP: 124/66  Pulse: 69   General: No acute distress Head:  Normocephalic/atraumatic Skin/Extremities: No rash, no edema Neurological Exam: Mental status: alert and oriented to person, place, and time, no dysarthria or aphasia, Fund of knowledge is appropriate.  Recent and remote memory are intact.  Attention and concentration are normal.  SLUMS score 23/30 [employer] Mental Exam 11/08/2019  Weekday Correct 1  Current year 1  What state are we in? 1  Amount spent 1  Amount left 2  # of Animals 1  5 objects recall 4  Number series 2  Hour markers 2  Time correct 2  Placed X in triangle correctly 1  Largest Figure 1  Name of male 2  Date back to work 0  Type of work 2  State she lived in 0  Total score 23    Cranial nerves: CN I: not tested CN II: pupils equal, round and reactive to light, visual fields intact CN III, IV, VI:  full range of motion, no nystagmus, no ptosis CN V: facial sensation intact CN VII: upper and lower face symmetric CN VIII: hearing intact to conversation CN IX, X: gag intact,  uvula midline CN XI: sternocleidomastoid and trapezius muscles intact CN XII: tongue midline Bulk & Tone: normal, no fasciculations. Motor: 5/5 throughout with no pronator drift. Sensation: intact to light touch, cold, pin, vibration and joint position sense.  No extinction to double simultaneous stimulation.  Romberg test negative Deep Tendon Reflexes: +2 throughout except for absent ankle jerks Plantar responses: downgoing bilaterally Cerebellar: no incoordination on finger to nose testing Gait: narrow-based and steady, no magnetic gait  noted Tremor: none  IMPRESSION: This is a pleasant 80 year old right-handed man with a history of hypertension, hyperlipidemia, bipolar disorder, NPH s/p VP shunt, presenting for memory evaluation. He has not noticed any memory changes since VP shunt was turned off after recent left convexity SDH. He is alone in the office with no family to corroborate history, and denies any difficulties with complex tasks. SLUMS score today 23/30. No indication to start cholinesterase inhibitors. We discussed Neurocognitive testing for a more in-depth cognitive evaluation, and have agreed to hold off for now as he is doing well, continue to monitor. His last fall was a week ago, he hopes to return to pre-SDH setting, since this had significantly helped with gait difficulties in the past. Continue follow-up with [facility] Neurosurgery. We discussed the importance of control of vascular risk factors, avoidance of alcohol, physical exercise, and brain stimulation exercises for overall brain health. Follow-up in 6 months, he knows to call for any changes.   Thank you for allowing me to participate in the care of this patient. Please do not hesitate to call for any questions or concerns.   Ellouise Newer, M.D.  CC: Dr. Sandi Mariscal

## 2019-11-08 NOTE — Patient Instructions (Signed)
Great meeting you! Wishing you well with upcoming visits with Neurosurgery. Continue fall precautions. Follow-up in 6 months, call for any changes.  FALL PRECAUTIONS: Be cautious when walking. Scan the area for obstacles that may increase the risk of trips and falls. When getting up in the mornings, sit up at the edge of the bed for a few minutes before getting out of bed. Consider elevating the bed at the head end to avoid drop of blood pressure when getting up. Walk always in a well-lit room (use night lights in the walls). Avoid area rugs or power cords from appliances in the middle of the walkways. Use a walker or a cane if necessary and consider physical therapy for balance exercise. Get your eyesight checked regularly.   RECOMMENDATIONS FOR ALL PATIENTS WITH MEMORY PROBLEMS: 1. Continue to exercise (Recommend 30 minutes of walking everyday, or 3 hours every week) 2. Increase social interactions - continue going to [place] and enjoy social gatherings with friends and family 3. Eat healthy, avoid fried foods and eat more fruits and vegetables 4. Maintain adequate blood pressure, blood sugar, and blood cholesterol level. Reducing the risk of stroke and cardiovascular disease also helps promoting better memory. 5. Avoid stressful situations. Live a simple life and avoid aggravations. Organize your time and prepare for the next day in anticipation. 6. Sleep well, avoid any interruptions of sleep and avoid any distractions in the bedroom that may interfere with adequate sleep quality 7. Avoid sugar, avoid sweets as there is a strong link between excessive sugar intake, diabetes, and cognitive impairment We discussed the Mediterranean diet, which has been shown to help patients reduce the risk of progressive memory disorders and reduces cardiovascular risk. This includes eating fish, eat fruits and green leafy vegetables, nuts like almonds and hazelnuts, walnuts, and also use olive oil. Avoid fast foods  and fried foods as much as possible. Avoid sweets and sugar as sugar use has been linked to worsening of memory function.

## 2019-11-24 ENCOUNTER — Ambulatory Visit: Payer: Medicare Other

## 2019-12-03 ENCOUNTER — Ambulatory Visit: Payer: Medicare Other | Attending: Internal Medicine

## 2019-12-03 DIAGNOSIS — Z23 Encounter for immunization: Secondary | ICD-10-CM | POA: Insufficient documentation

## 2019-12-03 NOTE — Progress Notes (Signed)
   Covid-19 Vaccination Clinic  Name:  Clayton Burke    MRN: [id number] DOB: [birth year]  12/03/2019  [name] was observed post Covid-19 immunization for 15 minutes without incidence. He was provided with Vaccine Information Sheet and instruction to access the V-Safe system.   [name] was instructed to call 911 with any severe reactions post vaccine: Marland Kitchen Difficulty breathing  . Swelling of your face and throat  . A fast heartbeat  . A bad rash all over your body  . Dizziness and weakness    Immunizations Administered    Name Date Dose VIS Date Route   Pfizer COVID-19 Vaccine 12/03/2019  9:35 AM 0.3 mL 10/08/2019 Intramuscular   Manufacturer: [employer], [employer]   Lot: [id number]   NDC: [address]-4

## 2019-12-15 ENCOUNTER — Other Ambulatory Visit: Payer: Self-pay | Admitting: Family Medicine

## 2019-12-15 DIAGNOSIS — M79605 Pain in left leg: Secondary | ICD-10-CM

## 2019-12-15 DIAGNOSIS — R29898 Other symptoms and signs involving the musculoskeletal system: Secondary | ICD-10-CM

## 2019-12-16 ENCOUNTER — Ambulatory Visit
Admission: RE | Admit: 2019-12-16 | Discharge: 2019-12-16 | Disposition: A | Discharge status: Home / Self Care | Payer: Medicare Other | Source: Ambulatory Visit | Attending: Family Medicine | Admitting: Family Medicine

## 2019-12-16 ENCOUNTER — Other Ambulatory Visit: Payer: Self-pay | Admitting: Family Medicine

## 2019-12-16 DIAGNOSIS — S065XAA Traumatic subdural hemorrhage with loss of consciousness status unknown, initial encounter: Secondary | ICD-10-CM

## 2019-12-16 DIAGNOSIS — S065X9A Traumatic subdural hemorrhage with loss of consciousness of unspecified duration, initial encounter: Secondary | ICD-10-CM

## 2019-12-28 ENCOUNTER — Ambulatory Visit: Payer: Medicare Other | Attending: Internal Medicine

## 2019-12-28 DIAGNOSIS — Z23 Encounter for immunization: Secondary | ICD-10-CM | POA: Insufficient documentation

## 2019-12-28 NOTE — Progress Notes (Signed)
   Covid-19 Vaccination Clinic  Name:  Clayton Burke    MRN: [id number] DOB: 11/03/1940  12/28/2019  Clayton Burke was observed post Covid-19 immunization for 15 minutes without incident. He was provided with Vaccine Information Sheet and instruction to access the V-Safe system.   Clayton Burke was instructed to call 911 with any severe reactions post vaccine: Marland Kitchen Difficulty breathing  . Swelling of face and throat  . A fast heartbeat  . A bad rash all over body  . Dizziness and weakness   Immunizations Administered    Name Date Dose VIS Date Route   Pfizer COVID-19 Vaccine 12/28/2019 10:18 AM 0.3 mL 10/08/2019 Intramuscular   Manufacturer: [employer], [employer]   Lot: [id number]   NDC: [address]-7

## 2020-01-10 ENCOUNTER — Other Ambulatory Visit: Payer: Self-pay

## 2020-01-10 ENCOUNTER — Ambulatory Visit
Admission: RE | Admit: 2020-01-10 | Discharge: 2020-01-10 | Disposition: A | Discharge status: Home / Self Care | Payer: Medicare Other | Source: Ambulatory Visit | Attending: Family Medicine | Admitting: Family Medicine

## 2020-01-10 DIAGNOSIS — R29898 Other symptoms and signs involving the musculoskeletal system: Secondary | ICD-10-CM

## 2020-01-10 DIAGNOSIS — M79605 Pain in left leg: Secondary | ICD-10-CM

## 2020-02-10 ENCOUNTER — Ambulatory Visit
Admission: RE | Admit: 2020-02-10 | Discharge: 2020-02-10 | Disposition: A | Discharge status: Home / Self Care | Payer: Medicare Other | Source: Ambulatory Visit | Attending: Family Medicine | Admitting: Family Medicine

## 2020-02-10 DIAGNOSIS — M79605 Pain in left leg: Secondary | ICD-10-CM

## 2020-02-10 DIAGNOSIS — R29898 Other symptoms and signs involving the musculoskeletal system: Secondary | ICD-10-CM

## 2020-02-16 ENCOUNTER — Other Ambulatory Visit: Payer: Medicare Other

## 2020-02-17 ENCOUNTER — Other Ambulatory Visit: Payer: Medicare Other

## 2020-02-22 ENCOUNTER — Telehealth: Payer: Self-pay | Admitting: Neurology

## 2020-02-22 NOTE — Telephone Encounter (Signed)
Patient was just seen by Patrcia Dolly this past January. I don;t feel that I have anymore to add to his care than Dr. Anne Hahn or Dr. Karel Jarvis. 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  Karel Jarvis who did an excellent evaluation in January with thorough assessment and plan. Dr. Karel Jarvis is excellent and I have no more to add to his care. thanks

## 2020-02-22 NOTE — Telephone Encounter (Signed)
Pt has called asking to be changed from Dr Anne Hahn to Dr Lucia Gaskins, Pt was told his request would be submitted and that he would be contacted with a response to his request.

## 2020-02-23 NOTE — Telephone Encounter (Signed)
Pt returned call, RN asked if I can inform him of Dr. Trevor Mace message. Once pt was informed pt stated that he is needing to see Dr. Lucia Gaskins for a cognitive test and not for his gait. Please advise.

## 2020-02-23 NOTE — Telephone Encounter (Signed)
Pt's wife called and requested to speak to Dr. Lucia Gaskins about taking the pt on as her's. I spoke to Dr. Lucia Gaskins and was informed to tell the wife once again that due to the pt already seeing Dr. Karel Jarvis and a cognitive test being offered that he should follow up and agree for Dr. Karel Jarvis to order it for him. Unfortunately at this time Dr. Lucia Gaskins has nothing else to add to the pt's care.

## 2020-02-23 NOTE — Telephone Encounter (Signed)
Left vm for patient to call back about wanting to change over to Dr.Ahern.

## 2020-02-23 NOTE — Telephone Encounter (Signed)
Left vm for pt to call back about wanting to switch to Dr. Lucia Gaskins. Below is Dr.Ahern is another message concerning pts request.

## 2020-02-23 NOTE — Telephone Encounter (Signed)
I spoke with Dr.Ahern that pt wants to be switch to her for cognitive  issues not gait.She stated Dr.Aquino offer to refer him to a MD for neurocognitive testing and he decline and his memory has been discuss by her. Dr. Lucia Gaskins thinks it best that pt continue to see Dr.Aquino for all of his neurological issues and has done an excellent in treating. Per.Dr.Ahern does not have more to offer.

## 2020-03-06 ENCOUNTER — Other Ambulatory Visit: Payer: Self-pay

## 2020-03-06 ENCOUNTER — Other Ambulatory Visit: Payer: Self-pay | Admitting: Family Medicine

## 2020-03-06 ENCOUNTER — Ambulatory Visit
Admission: RE | Admit: 2020-03-06 | Discharge: 2020-03-06 | Disposition: A | Discharge status: Home / Self Care | Payer: Medicare Other | Source: Ambulatory Visit | Attending: Family Medicine | Admitting: Family Medicine

## 2020-03-06 DIAGNOSIS — S0990XA Unspecified injury of head, initial encounter: Secondary | ICD-10-CM

## 2020-05-10 ENCOUNTER — Other Ambulatory Visit: Payer: Self-pay | Admitting: Family Medicine

## 2020-05-10 DIAGNOSIS — M79605 Pain in left leg: Secondary | ICD-10-CM

## 2020-05-14 ENCOUNTER — Other Ambulatory Visit: Payer: Medicare Other

## 2020-05-30 ENCOUNTER — Ambulatory Visit: Payer: Medicare Other | Admitting: Neurology

## 2020-06-05 ENCOUNTER — Other Ambulatory Visit: Payer: Medicare Other

## 2020-06-08 ENCOUNTER — Ambulatory Visit
Admission: RE | Admit: 2020-06-08 | Discharge: 2020-06-08 | Disposition: A | Discharge status: Home / Self Care | Payer: Medicare Other | Source: Ambulatory Visit | Attending: Family Medicine | Admitting: Family Medicine

## 2020-06-08 ENCOUNTER — Other Ambulatory Visit: Payer: Self-pay

## 2020-06-08 DIAGNOSIS — M79605 Pain in left leg: Secondary | ICD-10-CM

## 2020-07-24 ENCOUNTER — Encounter: Payer: Medicare Other | Admitting: Neurology

## 2020-12-26 DEATH — deceased
